# Patient Record
Sex: Male | Born: 1975 | Race: White | Hispanic: No | Marital: Married | Smoking: Never smoker
Health system: Southern US, Community
[De-identification: ages and names within clinical notes are randomized; demographics above are authoritative.]

## PROBLEM LIST (undated history)

## (undated) DIAGNOSIS — D649 Anemia, unspecified: Secondary | ICD-10-CM

## (undated) DIAGNOSIS — E785 Hyperlipidemia, unspecified: Secondary | ICD-10-CM

## (undated) DIAGNOSIS — K219 Gastro-esophageal reflux disease without esophagitis: Secondary | ICD-10-CM

## (undated) HISTORY — DX: Hyperlipidemia, unspecified: E78.5

## (undated) HISTORY — PX: HERNIA REPAIR: SHX51

---

## 2014-06-07 ENCOUNTER — Encounter: Payer: Self-pay | Admitting: Family Medicine

## 2014-06-07 ENCOUNTER — Ambulatory Visit (INDEPENDENT_AMBULATORY_CARE_PROVIDER_SITE_OTHER): Payer: PRIVATE HEALTH INSURANCE | Admitting: Family Medicine

## 2014-06-07 VITALS — BP 122/84 | Ht 75.0 in | Wt 355.0 lb

## 2014-06-07 DIAGNOSIS — E785 Hyperlipidemia, unspecified: Secondary | ICD-10-CM | POA: Insufficient documentation

## 2014-06-07 DIAGNOSIS — Z0000 Encounter for general adult medical examination without abnormal findings: Secondary | ICD-10-CM

## 2014-06-07 DIAGNOSIS — Z79899 Other long term (current) drug therapy: Secondary | ICD-10-CM

## 2014-06-07 MED ORDER — SIMVASTATIN 20 MG PO TABS: 20.0000 mg | ORAL_TABLET | Freq: Every day | ORAL | Status: DC

## 2014-06-07 NOTE — Progress Notes (Signed)
   Subjective:    Patient ID: John Terrell, male    DOB: 01/06/1976, 38 y.o.   MRN: [id number]  HPI The patient comes in today for a wellness visit.    A review of their health history was completed.  A review of medications was also completed.  Any needed refills; yes on simvastatin.   Eating habits: heatlh conscious  Falls/  MVA accidents in past few months: none  Regular exercise: walks and goes to gym as time permits.   Specialist pt sees on regular basis:none  Preventative health issues were discussed.   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 Negative for fever, activity change and appetite change.       Difficulty with substantial weight gain past 5 years. History of weight issues since college  HENT: Negative for congestion and rhinorrhea.   Eyes: Negative for discharge.  Respiratory: Negative for cough and wheezing.   Cardiovascular: Negative for chest pain.  Gastrointestinal: Negative for vomiting, abdominal pain and blood in stool.  Genitourinary: Negative for frequency and difficulty urinating.  Musculoskeletal: Negative for neck pain.  Skin: Negative for rash.  Allergic/Immunologic: Negative for environmental allergies and food allergies.  Neurological: Negative for weakness and headaches.  Psychiatric/Behavioral: Negative for agitation.  All other systems reviewed and are negative.      Objective:   Physical Exam  Constitutional: He appears well-developed and well-nourished.  Morbid obesity present BMI 44  HENT:  Head: Normocephalic and  atraumatic.  Right Ear: External ear normal.  Left Ear: External ear normal.  Nose: Nose normal.  Mouth/Throat: Oropharynx is clear and moist.  Eyes: EOM are normal. Pupils are equal, round, and reactive to light.  Neck: Normal range of motion. Neck supple. No thyromegaly present.  Cardiovascular: Normal rate, regular rhythm and normal heart sounds.   No murmur heard. Pulmonary/Chest: Effort normal and breath sounds normal. No respiratory distress. He has no wheezes.  Abdominal: Soft. Bowel sounds are normal. He exhibits no distension and no mass. There is no tenderness.  Genitourinary: Penis normal.  Musculoskeletal: Normal range of motion. He exhibits no edema.  Lymphadenopathy:    He has no cervical adenopathy.  Neurological: He is alert. He exhibits normal muscle tone.  Skin: Skin is warm and dry. No erythema.  Psychiatric: He has a normal mood and affect. His behavior is normal. Judgment normal.  Vitals reviewed.         Assessment & Plan:  Impression #1 wellness exam #2 morbid obesity discussed at length #3 hyperlipidemia status uncertain plan patient has artery had flu shot. Appropriate blood work. Old records. Exercise strongly encourage. Patient to start soon. Patient also to start Roby Loftseutra start has artery lost 5 pounds. Recheck every 6 months. WSL

## 2014-06-14 ENCOUNTER — Telehealth: Payer: Self-pay | Admitting: Family Medicine

## 2014-06-14 NOTE — Telephone Encounter (Signed)
Medical Records sent back in blue folder to Dr. Brett CanalesSteve.

## 2014-12-01 LAB — GLUCOSE, RANDOM: GLUCOSE: 102 mg/dL — AB (ref 70–99)

## 2014-12-01 LAB — LIPID PANEL
CHOLESTEROL: 160 mg/dL (ref 0–200)
HDL: 38 mg/dL — AB (ref >=40)
LDL CALC: 108 mg/dL — AB (ref 0–99)
Total CHOL/HDL Ratio: 4.2 Ratio
Triglycerides: 68 mg/dL (ref <150)
VLDL: 14 mg/dL (ref 0–40)

## 2014-12-01 LAB — HEPATIC FUNCTION PANEL
ALK PHOS: 66 U/L (ref 39–117)
ALT: 24 U/L (ref 0–53)
AST: 18 U/L (ref 0–37)
Albumin: 4.1 g/dL (ref 3.5–5.2)
Bilirubin, Direct: 0.1 mg/dL (ref 0.0–0.3)
Indirect Bilirubin: 0.5 mg/dL (ref 0.2–1.2)
TOTAL PROTEIN: 6.4 g/dL (ref 6.0–8.3)
Total Bilirubin: 0.6 mg/dL (ref 0.2–1.2)

## 2014-12-03 ENCOUNTER — Encounter: Payer: Self-pay | Admitting: Family Medicine

## 2014-12-03 ENCOUNTER — Ambulatory Visit (INDEPENDENT_AMBULATORY_CARE_PROVIDER_SITE_OTHER): Payer: PRIVATE HEALTH INSURANCE | Admitting: Family Medicine

## 2014-12-03 VITALS — BP 130/84 | Ht 75.0 in | Wt 376.0 lb

## 2014-12-03 DIAGNOSIS — E785 Hyperlipidemia, unspecified: Secondary | ICD-10-CM

## 2014-12-03 DIAGNOSIS — R7301 Impaired fasting glucose: Secondary | ICD-10-CM

## 2014-12-03 MED ORDER — CEFDINIR 300 MG PO CAPS: ORAL_CAPSULE | ORAL | Status: DC

## 2014-12-03 MED ORDER — FLUTICASONE PROPIONATE 50 MCG/ACT NA SUSP: 2.0000 | Freq: Every day | NASAL | Status: DC

## 2014-12-03 MED ORDER — SIMVASTATIN 20 MG PO TABS: 20.0000 mg | ORAL_TABLET | Freq: Every day | ORAL | Status: DC

## 2014-12-03 NOTE — Progress Notes (Signed)
   Subjective:    Patient ID: John Terrell, male    DOB: 07-09-1975, 39 y.o.   MRN: [id number]  Hyperlipidemia This is a chronic problem. The current episode started more than 1 year ago. Treatments tried: Simvastatin. There are no compliance problems.     Patient also has concerns of current congestion with an onset of 3 months. Facial pressure and congestion and clearing throat and with mucus and congesion  At sleep sometimes nose feels irritated, congestd in the nostrils  No hx of spring allergies, sinus round jan Patient has taken OTC for congestion with no relief. Patient states no other concerns at this time.   Patient done with ongoing challenges of morbid obesity. Some fatigue with this. Some chronic shortness of breath. Unfortunately not exercising. Next  Had blood work which showed mild elevated glucose. There is some family history of diabetes.  Results for orders placed or performed in visit on 06/07/14  Lipid panel  Result Value Ref Range   Cholesterol 160 0 - 200 mg/dL   Triglycerides 68 <597 mg/dL   HDL 38 (L) >=41 mg/dL   Total CHOL/HDL Ratio 4.2 Ratio   VLDL 14 0 - 40 mg/dL   LDL Cholesterol 638 (H) 0 - 99 mg/dL  Hepatic function panel  Result Value Ref Range   Total Bilirubin 0.6 0.2 - 1.2 mg/dL   Bilirubin, Direct 0.1 0.0 - 0.3 mg/dL   Indirect Bilirubin 0.5 0.2 - 1.2 mg/dL   Alkaline Phosphatase 66 39 - 117 U/L   AST 18 0 - 37 U/L   ALT 24 0 - 53 U/L   Total Protein 6.4 6.0 - 8.3 g/dL   Albumin 4.1 3.5 - 5.2 g/dL  Glucose, random  Result Value Ref Range   Glucose, Bld 102 (H) 70 - 99 mg/dL   Done well on exercising but really poor on eating  No close fam hx of diab, but some glu intol in the family  Drinks diet drinks or water, no reg sodas in twenty yrs    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   Review of Systems  no headache no chest pain some  chronic low back pain no change in bowel habits no blood in stool no rash    Objective:   Physical Exam  alert vitals stable blood pressure good on repeat. H&T normal. Lungs clear. Heart regular in rhythm. No CVA tenderness.  some persistent nasal congestion.      Assessment & Plan:   impression 1 hyperlipidemia controlled good #2 subacute sinusitis. With likely allergy element. #3 morbid obesity discussed length #4 impaired fasting glucose discussed at length land diet exercise discussed. Maintain same meds. Add antivirals. Add Flonase. Recheck in 6 months. WSL

## 2014-12-05 DIAGNOSIS — R7301 Impaired fasting glucose: Secondary | ICD-10-CM | POA: Insufficient documentation

## 2014-12-05 DIAGNOSIS — R7303 Prediabetes: Secondary | ICD-10-CM | POA: Insufficient documentation

## 2015-01-13 ENCOUNTER — Encounter: Payer: Self-pay | Admitting: Family Medicine

## 2015-01-13 ENCOUNTER — Ambulatory Visit (INDEPENDENT_AMBULATORY_CARE_PROVIDER_SITE_OTHER): Payer: PRIVATE HEALTH INSURANCE | Admitting: Family Medicine

## 2015-01-13 VITALS — BP 128/80 | Temp 99.9°F | Ht 74.0 in | Wt 370.0 lb

## 2015-01-13 DIAGNOSIS — B9689 Other specified bacterial agents as the cause of diseases classified elsewhere: Secondary | ICD-10-CM

## 2015-01-13 DIAGNOSIS — J019 Acute sinusitis, unspecified: Secondary | ICD-10-CM

## 2015-01-13 MED ORDER — LEVOFLOXACIN 500 MG PO TABS: 500.0000 mg | ORAL_TABLET | Freq: Every day | ORAL | Status: DC

## 2015-01-13 NOTE — Progress Notes (Signed)
   Subjective:    Patient ID: John Terrell, male    DOB: [birth year], 39 y.o.   MRN: [id number]  Cough This is a new problem. Episode onset: 3 days ago. Associated symptoms include a fever, headaches, nasal congestion, rhinorrhea and a sore throat. Pertinent negatives include no chest pain, ear pain or wheezing. Treatments tried: mucinex.   PMH intermittent sinus infections   Review of Systems  Constitutional: Positive for fever. Negative for activity change.  HENT: Positive for congestion, rhinorrhea and sore throat. Negative for ear pain.   Eyes: Negative for discharge.  Respiratory: Positive for cough. Negative for wheezing.   Cardiovascular: Negative for chest pain.  Neurological: Positive for headaches.       Objective:   Physical Exam  Constitutional: He appears well-developed.  HENT:  Head: Normocephalic.  Mouth/Throat: Oropharynx is clear and moist. No oropharyngeal exudate.  Neck: Normal range of motion.  Cardiovascular: Normal rate, regular rhythm and normal heart sounds.   No murmur heard. Pulmonary/Chest: Effort normal and breath sounds normal. He has no wheezes.  Lymphadenopathy:    He has no cervical adenopathy.  Neurological: He exhibits normal muscle tone.  Skin: Skin is warm and dry.  Nursing note and vitals reviewed.   Moderate sinus tenderness no sign of lung involvement      Assessment & Plan:  Viral syndrome Low-grade fever Secondary sinusitis Antibiotics prescribed warning signs discussed follow-up if problems

## 2015-03-16 ENCOUNTER — Ambulatory Visit (INDEPENDENT_AMBULATORY_CARE_PROVIDER_SITE_OTHER): Payer: PRIVATE HEALTH INSURANCE | Admitting: Family Medicine

## 2015-03-16 ENCOUNTER — Encounter: Payer: Self-pay | Admitting: Family Medicine

## 2015-03-16 VITALS — BP 110/72 | Ht 75.0 in | Wt 375.6 lb

## 2015-03-16 DIAGNOSIS — J019 Acute sinusitis, unspecified: Secondary | ICD-10-CM | POA: Diagnosis not present

## 2015-03-16 DIAGNOSIS — B9689 Other specified bacterial agents as the cause of diseases classified elsewhere: Secondary | ICD-10-CM

## 2015-03-16 MED ORDER — AZITHROMYCIN 250 MG PO TABS: ORAL_TABLET | ORAL | Status: DC

## 2015-03-16 MED ORDER — HYDROCODONE-HOMATROPINE 5-1.5 MG/5ML PO SYRP: ORAL_SOLUTION | ORAL | Status: DC

## 2015-03-16 NOTE — Progress Notes (Signed)
   Subjective:    Patient ID: John Terrell, male    DOB: [birth year], 39 y.o.   MRN: [id number]  Sinus Problem This is a new problem. The current episode started in the past 7 days. Associated symptoms include congestion, coughing, headaches and a sore throat. Treatments tried: mucinex.   Sat started with cold, course of this wk sinus  Low gr temp felt sweating and achey  With drainage, gunky mucus     Review of Systems  HENT: Positive for congestion and sore throat.   Respiratory: Positive for cough.   Neurological: Positive for headaches.       Objective:   Physical Exam   Alert moderate malaise vitals stable HET moderate nasal congestion frontal tenderness pharynx erythematous. Neck supple lungs clear. Heart regular in rhythm.     Assessment & Plan:  Impression acute rhinosinusitis plan antibiotics prescribed. Symptom care discussed. Warning signs discussed WSL

## 2015-06-08 ENCOUNTER — Other Ambulatory Visit: Payer: Self-pay | Admitting: Family Medicine

## 2015-06-10 ENCOUNTER — Encounter: Payer: PRIVATE HEALTH INSURANCE | Admitting: Family Medicine

## 2015-07-05 ENCOUNTER — Encounter: Payer: PRIVATE HEALTH INSURANCE | Admitting: Family Medicine

## 2015-07-16 ENCOUNTER — Other Ambulatory Visit: Payer: Self-pay | Admitting: Family Medicine

## 2015-07-18 ENCOUNTER — Telehealth: Payer: Self-pay | Admitting: Family Medicine

## 2015-07-18 NOTE — Telephone Encounter (Signed)
Pt is needing a refill on his simvastatin (ZOCOR) 20 MG tablet.     [facility]

## 2015-07-18 NOTE — Telephone Encounter (Signed)
[facility] 07/18/15 ( medication refilled for 30 days needs office visit.)

## 2015-08-04 ENCOUNTER — Encounter: Payer: Self-pay | Admitting: Family Medicine

## 2015-08-04 ENCOUNTER — Ambulatory Visit (INDEPENDENT_AMBULATORY_CARE_PROVIDER_SITE_OTHER): Payer: PRIVATE HEALTH INSURANCE | Admitting: Family Medicine

## 2015-08-04 VITALS — BP 128/84 | Ht 75.0 in | Wt 382.0 lb

## 2015-08-04 DIAGNOSIS — E785 Hyperlipidemia, unspecified: Secondary | ICD-10-CM

## 2015-08-04 DIAGNOSIS — Z0000 Encounter for general adult medical examination without abnormal findings: Secondary | ICD-10-CM

## 2015-08-04 DIAGNOSIS — Z131 Encounter for screening for diabetes mellitus: Secondary | ICD-10-CM | POA: Diagnosis not present

## 2015-08-04 DIAGNOSIS — Z79899 Other long term (current) drug therapy: Secondary | ICD-10-CM | POA: Diagnosis not present

## 2015-08-04 DIAGNOSIS — R5383 Other fatigue: Secondary | ICD-10-CM

## 2015-08-04 DIAGNOSIS — Z13220 Encounter for screening for lipoid disorders: Secondary | ICD-10-CM

## 2015-08-04 MED ORDER — FLUTICASONE PROPIONATE 50 MCG/ACT NA SUSP: 2.0000 | Freq: Every day | NASAL | Status: DC

## 2015-08-04 MED ORDER — SIMVASTATIN 20 MG PO TABS: ORAL_TABLET | ORAL | Status: DC

## 2015-08-04 NOTE — Progress Notes (Signed)
   Subjective:    Patient ID: John Terrell, male    DOB: [birth year], 40 y.o.   MRN: [id number]  HPI Compliant with lipid medications. No obvious side effects. Meds reviewed today. Recent blood work reviewed once again. Claims compliance with the medicine. Mostly watching fat diet exercise often been lately   Also The patient comes in today for a wellness visit.  Both parents still alive, mo is having some cholesterol issues  No colonc ca    A review of their health history was completed.  A review of medications was also completed.  Any needed refills; yes  Eating habits: trying to eat healthy  Falls/  MVA accidents in past few months: none  Regular exercise: some at gym and walking, ove4all not so good with exrcise, notrs substantial fatigue and inability to carve out time for exercise  Generally does 45 min of cario plus weights  Some sports in [employer]   Specialist pt sees on regular basis: no  Preventative health issues were discussed.   Additional concerns: none   Review of Systems  Constitutional: Negative for fever, activity change, appetite change and fatigue.  HENT: Negative for congestion and rhinorrhea.   Eyes: Negative for discharge.  Respiratory: Negative for cough and wheezing.   Cardiovascular: Negative for chest pain.  Gastrointestinal: Negative for vomiting, abdominal pain and blood in stool.  Genitourinary: Negative for frequency and difficulty urinating.  Musculoskeletal: Negative for neck pain.  Skin: Negative for rash.  Allergic/Immunologic: Negative for environmental allergies and food allergies.  Neurological: Negative for weakness and headaches.  Psychiatric/Behavioral: Negative for behavioral problems and agitation.       Objective:   Physical Exam  Constitutional: He appears well-developed and well-nourished.  Substantial morbid obesity present  HENT:  Head: Normocephalic and atraumatic.  Right Ear: External ear normal.  Left  Ear: External ear normal.  Nose: Nose normal.  Mouth/Throat: Oropharynx is clear and moist.  Eyes: EOM are normal. Pupils are equal, round, and reactive to light.  Neck: Normal range of motion. Neck supple. No thyromegaly present.  Cardiovascular: Normal rate, regular rhythm and normal heart sounds.   No murmur heard. Pulmonary/Chest: Effort normal and breath sounds normal. No respiratory distress. He has no wheezes.  Abdominal: Soft. Bowel sounds are normal. He exhibits no distension and no mass. There is no tenderness.  Genitourinary: Penis normal.  Musculoskeletal: Normal range of motion. He exhibits no edema.  Lymphadenopathy:    He has no cervical adenopathy.  Neurological: He is alert. He exhibits normal muscle tone.  Skin: Skin is warm and dry. No erythema.  Psychiatric: He has a normal mood and affect. His behavior is normal. Judgment normal.  Vitals reviewed.         Assessment & Plan:  Impression 1 wellness exam #2 morbid obesesity discussed at great length #3 hyperlipidemia control uncertain discuss time for blood work meds reviewed #4 impaired fasting glucose status uncertain plan appropriate blood work. Patient to get back into diet and exercise more. I've advised if he doesn't start turning the corner with his BMI we will need to consider bariatric intervention long discussion held in this regard

## 2015-08-16 ENCOUNTER — Other Ambulatory Visit: Payer: Self-pay | Admitting: Family Medicine

## 2015-08-17 ENCOUNTER — Other Ambulatory Visit: Payer: Self-pay | Admitting: Family Medicine

## 2015-08-30 LAB — LIPID PANEL
Chol/HDL Ratio: 4.3 ratio units (ref 0.0–5.0)
Cholesterol, Total: 189 mg/dL (ref 100–199)
HDL: 44 mg/dL (ref >39)
LDL Calculated: 129 mg/dL — ABNORMAL HIGH (ref 0–99)
TRIGLYCERIDES: 81 mg/dL (ref 0–149)
VLDL Cholesterol Cal: 16 mg/dL (ref 5–40)

## 2015-08-30 LAB — HEPATIC FUNCTION PANEL
ALT: 49 IU/L — AB (ref 0–44)
AST: 54 IU/L — ABNORMAL HIGH (ref 0–40)
Albumin: 4.3 g/dL (ref 3.5–5.5)
Alkaline Phosphatase: 93 IU/L (ref 39–117)
BILIRUBIN TOTAL: 0.6 mg/dL (ref 0.0–1.2)
BILIRUBIN, DIRECT: 0.12 mg/dL (ref 0.00–0.40)
TOTAL PROTEIN: 7 g/dL (ref 6.0–8.5)

## 2015-08-30 LAB — GLUCOSE, RANDOM: Glucose: 102 mg/dL — ABNORMAL HIGH (ref 65–99)

## 2015-08-30 LAB — TSH: TSH: 5.79 u[IU]/mL — ABNORMAL HIGH (ref 0.450–4.500)

## 2015-09-08 ENCOUNTER — Encounter: Payer: Self-pay | Admitting: Family Medicine

## 2015-09-08 ENCOUNTER — Ambulatory Visit (INDEPENDENT_AMBULATORY_CARE_PROVIDER_SITE_OTHER): Payer: PRIVATE HEALTH INSURANCE | Admitting: Family Medicine

## 2015-09-08 VITALS — BP 134/90 | Ht 75.0 in | Wt 376.0 lb

## 2015-09-08 DIAGNOSIS — E785 Hyperlipidemia, unspecified: Secondary | ICD-10-CM | POA: Diagnosis not present

## 2015-09-08 DIAGNOSIS — R946 Abnormal results of thyroid function studies: Secondary | ICD-10-CM

## 2015-09-08 DIAGNOSIS — R7301 Impaired fasting glucose: Secondary | ICD-10-CM

## 2015-09-08 DIAGNOSIS — R7989 Other specified abnormal findings of blood chemistry: Secondary | ICD-10-CM

## 2015-09-08 DIAGNOSIS — R5383 Other fatigue: Secondary | ICD-10-CM

## 2015-09-08 DIAGNOSIS — Z79899 Other long term (current) drug therapy: Secondary | ICD-10-CM | POA: Diagnosis not present

## 2015-09-08 DIAGNOSIS — R748 Abnormal levels of other serum enzymes: Secondary | ICD-10-CM

## 2015-09-08 NOTE — Progress Notes (Signed)
   Subjective:    Patient ID: John Terrell, male    DOB: 08/31/1975, 40 y.o.   MRN: [id number] Patient arrives office for protracted discussion HPI No eating that mornign the day he took blood work   Results for orders placed or performed in visit on 08/04/15  Lipid panel  Result Value Ref Range   Cholesterol, Total 189 100 - 199 mg/dL   Triglycerides 81 0 - 149 mg/dL   HDL 44 >84>39 mg/dL   VLDL Cholesterol Cal 16 5 - 40 mg/dL   LDL Calculated 696129 (H) 0 - 99 mg/dL   Chol/HDL Ratio 4.3 0.0 - 5.0 ratio units  Hepatic function panel  Result Value Ref Range   Total Protein 7.0 6.0 - 8.5 g/dL   Albumin 4.3 3.5 - 5.5 g/dL   Bilirubin Total 0.6 0.0 - 1.2 mg/dL   Bilirubin, Direct 2.950.12 0.00 - 0.40 mg/dL   Alkaline Phosphatase 93 39 - 117 IU/L   AST 54 (H) 0 - 40 IU/L   ALT 49 (H) 0 - 44 IU/L  Glucose, random  Result Value Ref Range   Glucose 102 (H) 65 - 99 mg/dL  TSH  Result Value Ref Range   TSH 5.790 (H) 0.450 - 4.500 uIU/mL   Elevated Liver enzymes no hx of this . No substantial alcohol use. No history of hepatitis  Patient struggles with morbid obesity. Currently working on a new regimen of diet and exercise. This adds to increased fatigue and dyspnea. Next  Patient claims compliance with his lipid medication. Watching fat in his diet better job  No major family history diabetes or thyroid disease  Patient has never had abnormalities on thyroid blood work.  Patient is just started exercising regularly   Review of Systems No headache, no major weight loss or weight gain, no chest pain no back pain abdominal pain no change in bowel habits complete ROS otherwise negative     Objective:   Physical Exam  Alert no acute distress morbid obesity present blood pressure stable lungs clear heart rare rhythm ankles without edema    Assessment & Plan:  Impression 1 hyperlipidemia discussed at length slightly suboptimum but do not want to increase Zocor because of #2 #2 elevated  liver enzymes mild in nature may be related to Zocor but patient's Ronis a long time. Likely more related to obesity discussed #3 morbid obesity discussed #4 elevated TSH. No excess fatigue at this time. #5 impaired fasting glucose also discussed at length plan 25 minutes spent most in discussion metabolically patient is going wrong direction repeat blood work in several months further intervention at that time with all of these aspects discussed i.e. will need further liver assessment if persists WSL

## 2015-09-11 DIAGNOSIS — R748 Abnormal levels of other serum enzymes: Secondary | ICD-10-CM | POA: Insufficient documentation

## 2015-09-11 DIAGNOSIS — R7989 Other specified abnormal findings of blood chemistry: Secondary | ICD-10-CM | POA: Insufficient documentation

## 2015-12-07 ENCOUNTER — Telehealth: Payer: Self-pay | Admitting: Family Medicine

## 2015-12-07 NOTE — Telephone Encounter (Signed)
Med called into pharm. Pt notified on voicemail 

## 2015-12-07 NOTE — Telephone Encounter (Signed)
simvastatin (ZOCOR) 20 MG tablet   Pt is out of town an needs a one time refill  On this med. Can [facility]    [facility] [place] [phone] [address] on [address]

## 2016-03-10 LAB — HEPATIC FUNCTION PANEL
ALT: 22 IU/L (ref 0–44)
AST: 17 IU/L (ref 0–40)
Albumin: 4.3 g/dL (ref 3.5–5.5)
Alkaline Phosphatase: 86 IU/L (ref 39–117)
BILIRUBIN TOTAL: 0.6 mg/dL (ref 0.0–1.2)
BILIRUBIN, DIRECT: 0.14 mg/dL (ref 0.00–0.40)
TOTAL PROTEIN: 6.6 g/dL (ref 6.0–8.5)

## 2016-03-10 LAB — LIPID PANEL
CHOL/HDL RATIO: 4.7 ratio (ref 0.0–5.0)
Cholesterol, Total: 170 mg/dL (ref 100–199)
HDL: 36 mg/dL — ABNORMAL LOW (ref >39)
LDL CALC: 122 mg/dL — AB (ref 0–99)
Triglycerides: 61 mg/dL (ref 0–149)
VLDL Cholesterol Cal: 12 mg/dL (ref 5–40)

## 2016-03-10 LAB — TSH: TSH: 4.55 u[IU]/mL — ABNORMAL HIGH (ref 0.450–4.500)

## 2016-03-10 LAB — GLUCOSE, RANDOM: Glucose: 104 mg/dL — ABNORMAL HIGH (ref 65–99)

## 2016-03-19 ENCOUNTER — Other Ambulatory Visit: Payer: Self-pay | Admitting: Family Medicine

## 2016-03-20 ENCOUNTER — Ambulatory Visit: Payer: PRIVATE HEALTH INSURANCE | Admitting: Family Medicine

## 2016-03-23 ENCOUNTER — Ambulatory Visit (INDEPENDENT_AMBULATORY_CARE_PROVIDER_SITE_OTHER): Payer: PRIVATE HEALTH INSURANCE | Admitting: Family Medicine

## 2016-03-23 ENCOUNTER — Encounter: Payer: Self-pay | Admitting: Family Medicine

## 2016-03-23 VITALS — BP 128/74 | Ht 75.0 in | Wt 376.8 lb

## 2016-03-23 DIAGNOSIS — E785 Hyperlipidemia, unspecified: Secondary | ICD-10-CM | POA: Diagnosis not present

## 2016-03-23 DIAGNOSIS — R946 Abnormal results of thyroid function studies: Secondary | ICD-10-CM

## 2016-03-23 DIAGNOSIS — R748 Abnormal levels of other serum enzymes: Secondary | ICD-10-CM | POA: Diagnosis not present

## 2016-03-23 DIAGNOSIS — Z79899 Other long term (current) drug therapy: Secondary | ICD-10-CM

## 2016-03-23 DIAGNOSIS — R7989 Other specified abnormal findings of blood chemistry: Secondary | ICD-10-CM

## 2016-03-23 MED ORDER — SIMVASTATIN 20 MG PO TABS: 20.0000 mg | ORAL_TABLET | Freq: Every day | ORAL | 5 refills | Status: DC

## 2016-03-23 NOTE — Progress Notes (Signed)
   Subjective:    Patient ID: John Terrell, male    DOB: 12/18/1975, 40 y.o.   MRN: [id number] Patient arrives office for follow-up of numerous concerns Hyperlipidemia  This is a chronic problem. Treatments tried: simvastatin. There are no compliance problems (exericses at the gym regularly, health conscious with diet., takes meds every day. ).   Patient continues to take lipid medication regularly. No obvious side effects from it. Generally does not miss a dose. Prior blood work results are reviewed with patient. Patient continues to work on fat intake in diet  Patient had elevated liver enzymes on last blood work. Working diagnosis was likely secondary to fatty liver. Patient is work hard on diet. Cutting back fat intake. Lost 10 pounds.  Patient also has helped with elevation of sugars. Mild in nature. Working on this. No close family history of diabetes. Next  Patient realizes he is morbidly obese. Working hard on diet. Has started exercising very regularly Gets flu vaccine at work.   No concerns today.   Results for orders placed or performed in visit on 09/08/15  Lipid panel  Result Value Ref Range   Cholesterol, Total 170 100 - 199 mg/dL   Triglycerides 61 0 - 149 mg/dL   HDL 36 (L) >40>39 mg/dL   VLDL Cholesterol Cal 12 5 - 40 mg/dL   LDL Calculated 981122 (H) 0 - 99 mg/dL   Chol/HDL Ratio 4.7 0.0 - 5.0 ratio units  Hepatic function panel  Result Value Ref Range   Total Protein 6.6 6.0 - 8.5 g/dL   Albumin 4.3 3.5 - 5.5 g/dL   Bilirubin Total 0.6 0.0 - 1.2 mg/dL   Bilirubin, Direct 1.910.14 0.00 - 0.40 mg/dL   Alkaline Phosphatase 86 39 - 117 IU/L   AST 17 0 - 40 IU/L   ALT 22 0 - 44 IU/L  Glucose, random  Result Value Ref Range   Glucose 104 (H) 65 - 99 mg/dL  TSH  Result Value Ref Range   TSH 4.550 (H) 0.450 - 4.500 uIU/mL   Has worked harder on [employer] gym, doing cardio multi times per wk  Juicer bfast and lunch, juice only   trying to stay active and going to the parks  [place] for improving sleep       As lost less than ten pounds  mon has hi cholesterol, handled with diet   Impression and plan dictated here because computer not allowing me to place this under impression and plan  Impression #1 morbid obesity patient realizes this is serious. Has lost 10 pounds long way to go  #2 elevated liver enzymes currently resolved likely due to fatty liver #3 hyperlipidemia improved maintain same #4 prediabetes ongoing discussed encouraged to cut down #5 elevated TSH improved. Likely represents euthyroid slight elevation of TSH rationale discussed no treatment at this time diet exercise discussed. Maintain same dose medications. Recheck in 6 months. Blood work  Review of Systems No headache, no major weight loss or weight gain, no chest pain no  Weight down 10 pounds. Vitals stable blood pressure excellent on repeat morbid obesity persists. Lungs clear heart regular rate rhythm. Ankles without edema see above impression pain  no change in bowel habits complete ROS otherwise negative     Objective:   Physical Exam        Assessment & Plan:

## 2016-10-22 ENCOUNTER — Other Ambulatory Visit: Payer: Self-pay | Admitting: Family Medicine

## 2016-11-30 ENCOUNTER — Other Ambulatory Visit: Payer: Self-pay | Admitting: Family Medicine

## 2016-12-12 LAB — BASIC METABOLIC PANEL
BUN / CREAT RATIO: 15 (ref 9–20)
BUN: 12 mg/dL (ref 6–20)
CO2: 22 mmol/L (ref 20–29)
CREATININE: 0.79 mg/dL (ref 0.76–1.27)
Calcium: 8.8 mg/dL (ref 8.7–10.2)
Chloride: 103 mmol/L (ref 96–106)
GFR calc Af Amer: 132 mL/min/{1.73_m2} (ref >59)
GFR calc non Af Amer: 114 mL/min/{1.73_m2} (ref >59)
GLUCOSE: 106 mg/dL — AB (ref 65–99)
Potassium: 4.6 mmol/L (ref 3.5–5.2)
SODIUM: 143 mmol/L (ref 134–144)

## 2016-12-12 LAB — LIPID PANEL
CHOL/HDL RATIO: 5 ratio (ref 0.0–5.0)
CHOLESTEROL TOTAL: 205 mg/dL — AB (ref 100–199)
HDL: 41 mg/dL (ref >39)
LDL CALC: 147 mg/dL — AB (ref 0–99)
TRIGLYCERIDES: 87 mg/dL (ref 0–149)
VLDL Cholesterol Cal: 17 mg/dL (ref 5–40)

## 2016-12-12 LAB — HEPATIC FUNCTION PANEL
ALBUMIN: 3.9 g/dL (ref 3.5–5.5)
ALK PHOS: 79 IU/L (ref 39–117)
ALT: 23 IU/L (ref 0–44)
AST: 21 IU/L (ref 0–40)
BILIRUBIN, DIRECT: 0.12 mg/dL (ref 0.00–0.40)
Bilirubin Total: 0.4 mg/dL (ref 0.0–1.2)
TOTAL PROTEIN: 6 g/dL (ref 6.0–8.5)

## 2016-12-12 LAB — TSH: TSH: 7.35 u[IU]/mL — ABNORMAL HIGH (ref 0.450–4.500)

## 2016-12-17 ENCOUNTER — Telehealth: Payer: Self-pay | Admitting: Family Medicine

## 2016-12-17 NOTE — Telephone Encounter (Signed)
error 

## 2016-12-18 ENCOUNTER — Telehealth: Payer: Self-pay | Admitting: Family Medicine

## 2016-12-18 MED ORDER — SIMVASTATIN 20 MG PO TABS: 20.0000 mg | ORAL_TABLET | Freq: Every day | ORAL | 0 refills | Status: DC

## 2016-12-18 NOTE — Telephone Encounter (Signed)
Spoke with patient and informed him that refills were sent into pharmacy. Please keep office visit on 07/11. Patient verbalized understanding.

## 2016-12-18 NOTE — Telephone Encounter (Signed)
[facility] [place]

## 2017-01-02 ENCOUNTER — Encounter: Payer: Self-pay | Admitting: Family Medicine

## 2017-01-02 ENCOUNTER — Ambulatory Visit (INDEPENDENT_AMBULATORY_CARE_PROVIDER_SITE_OTHER): Payer: PRIVATE HEALTH INSURANCE | Admitting: Family Medicine

## 2017-01-02 VITALS — BP 126/78 | Ht 73.0 in | Wt 388.1 lb

## 2017-01-02 DIAGNOSIS — E7800 Pure hypercholesterolemia, unspecified: Secondary | ICD-10-CM | POA: Diagnosis not present

## 2017-01-02 DIAGNOSIS — R946 Abnormal results of thyroid function studies: Secondary | ICD-10-CM

## 2017-01-02 DIAGNOSIS — E785 Hyperlipidemia, unspecified: Secondary | ICD-10-CM | POA: Diagnosis not present

## 2017-01-02 DIAGNOSIS — Z0000 Encounter for general adult medical examination without abnormal findings: Secondary | ICD-10-CM | POA: Diagnosis not present

## 2017-01-02 DIAGNOSIS — R7989 Other specified abnormal findings of blood chemistry: Secondary | ICD-10-CM

## 2017-01-02 MED ORDER — SIMVASTATIN 20 MG PO TABS: 20.0000 mg | ORAL_TABLET | Freq: Every day | ORAL | 1 refills | Status: DC

## 2017-01-02 MED ORDER — LEVOTHYROXINE SODIUM 50 MCG PO TABS: 50.0000 ug | ORAL_TABLET | Freq: Every day | ORAL | 1 refills | Status: DC

## 2017-01-02 NOTE — Progress Notes (Signed)
 Subjective:    Patient ID: John Terrell, male    DOB: 09/06/1975, 40 y.o.   MRN: [id number]  HPI  The patient comes in today for a wellness visit.    A review of their health history was completed.  A review of medications was also completed.  Any needed refills; Yes   Eating habits: Patient states eating habits are good. Tries to watch what he eats.  Falls/  MVA accidents in past few months: None  Regular exercise:  Patient states exercises 3-4 times per week at the gym for an hour.   Specialist pt sees on regular basis: None   Preventative health issues were discussed.   Additional concerns:Patient has concerns of congestion to right ear. Has noticed diminished hearing in the year.  Patient continues to take lipid medication regularly. No obvious side effects from it. Generally does not miss a dose. Prior blood work results are reviewed with patient. Patient continues to work on fat intake in diet. LDL is suboptimal in. Patient would like to work harder on his own diet before going to higher dose of medication.  Hypothyroidism has now been confirmed on this set of blood work. TSH significantly elevated  Reports exercise has not been ideal due to both family and work stress worse. This has improved somewhat in hopes to start exercising more soon.    Review of Systems  Constitutional: Negative for activity change, appetite change and fever.  HENT: Negative for congestion and rhinorrhea.   Eyes: Negative for discharge.  Respiratory: Negative for cough and wheezing.   Cardiovascular: Negative for chest pain.  Gastrointestinal: Negative for abdominal pain, blood in stool and vomiting.  Genitourinary: Negative for difficulty urinating and frequency.  Musculoskeletal: Negative for neck pain.  Skin: Negative for rash.  Allergic/Immunologic: Negative for environmental allergies and food allergies.  Neurological: Negative for weakness and headaches.  Psychiatric/Behavioral:  Negative for agitation.  All other systems reviewed and are negative.      Objective:   Physical Exam  Constitutional: He appears well-developed and well-nourished.  Substantial obesity identified with BMI of 51  HENT:  Head: Normocephalic and atraumatic.  Right Ear: External ear normal.  Left Ear: External ear normal.  Nose: Nose normal.  Mouth/Throat: Oropharynx is clear and moist.  Bilateral cerumen impaction  Eyes: Pupils are equal, round, and reactive to light. EOM are normal.  Neck: Normal range of motion. Neck supple. No thyromegaly present.  Cardiovascular: Normal rate, regular rhythm and normal heart sounds.   No murmur heard. Pulmonary/Chest: Effort normal and breath sounds normal. No respiratory distress. He has no wheezes.  Abdominal: Soft. Bowel sounds are normal. He exhibits no distension and no mass. There is no tenderness.  Genitourinary: Penis normal.  Musculoskeletal: Normal range of motion. He exhibits no edema.  Lymphadenopathy:    He has no cervical adenopathy.  Neurological: He is alert. He exhibits normal muscle tone.  Skin: Skin is warm and dry. No erythema.  Psychiatric: He has a normal mood and affect. His behavior is normal. Judgment normal.  Vitals reviewed.         Assessment & Plan:  Impression wellness exam plus chronic health issue management visit.  Wellness. Diet discussed at length. Exercise discussed at length. Of concern is morbid obesity. Ongoing challenge for the patient. Continues to gain weight. Have advised patient if this persists will encourage more consideration towards bariatric intervention  Problem/chronic health management  #1 hyperlipidemia. Numbers suboptimum. Patient wishes to maintain same dose   for now will maintain next  #2 hypothyroidism we been watching this now I feel confirmed. Supplementation warranted discussed. No need for major workup at this time but simply supplementation discussed next  #3 cerumen  impaction given number for Dr. TL to consider visit for cerumen removal next  #4 morbid obesity is noted above  Follow-up in 6 months. Prescriptions refilled. Diet exercise discussed. Blood work reviewed. 

## 2017-01-03 NOTE — Progress Notes (Signed)
Subjective:    Patient ID: John Terrell, male    DOB: Dec 30, 1975, 41 y.o.   MRN: [id number]  HPI  The patient comes in today for a wellness visit.    A review of their health history was completed.  A review of medications was also completed.  Any needed refills; Yes   Eating habits: Patient states eating habits are good. Tries to watch what he eats.  Falls/  MVA accidents in past few months: None  Regular exercise:  Patient states exercises 3-4 times per week at the gym for an hour.   Specialist pt sees on regular basis: None   Preventative health issues were discussed.   Additional concerns:Patient has concerns of congestion to right ear. Has noticed diminished hearing in the year.  Patient continues to take lipid medication regularly. No obvious side effects from it. Generally does not miss a dose. Prior blood work results are reviewed with patient. Patient continues to work on fat intake in diet. LDL is suboptimal in. Patient would like to work harder on his own diet before going to higher dose of medication.  Hypothyroidism has now been confirmed on this set of blood work. TSH significantly elevated  Reports exercise has not been ideal due to both family and work stress worse. This has improved somewhat in hopes to start exercising more soon.    Review of Systems  Constitutional: Negative for activity change, appetite change and fever.  HENT: Negative for congestion and rhinorrhea.   Eyes: Negative for discharge.  Respiratory: Negative for cough and wheezing.   Cardiovascular: Negative for chest pain.  Gastrointestinal: Negative for abdominal pain, blood in stool and vomiting.  Genitourinary: Negative for difficulty urinating and frequency.  Musculoskeletal: Negative for neck pain.  Skin: Negative for rash.  Allergic/Immunologic: Negative for environmental allergies and food allergies.  Neurological: Negative for weakness and headaches.  Psychiatric/Behavioral:  Negative for agitation.  All other systems reviewed and are negative.      Objective:   Physical Exam  Constitutional: He appears well-developed and well-nourished.  Substantial obesity identified with BMI of 51  HENT:  Head: Normocephalic and atraumatic.  Right Ear: External ear normal.  Left Ear: External ear normal.  Nose: Nose normal.  Mouth/Throat: Oropharynx is clear and moist.  Bilateral cerumen impaction  Eyes: Pupils are equal, round, and reactive to light. EOM are normal.  Neck: Normal range of motion. Neck supple. No thyromegaly present.  Cardiovascular: Normal rate, regular rhythm and normal heart sounds.   No murmur heard. Pulmonary/Chest: Effort normal and breath sounds normal. No respiratory distress. He has no wheezes.  Abdominal: Soft. Bowel sounds are normal. He exhibits no distension and no mass. There is no tenderness.  Genitourinary: Penis normal.  Musculoskeletal: Normal range of motion. He exhibits no edema.  Lymphadenopathy:    He has no cervical adenopathy.  Neurological: He is alert. He exhibits normal muscle tone.  Skin: Skin is warm and dry. No erythema.  Psychiatric: He has a normal mood and affect. His behavior is normal. Judgment normal.  Vitals reviewed.         Assessment & Plan:  Impression wellness exam plus chronic health issue management visit.  Wellness. Diet discussed at length. Exercise discussed at length. Of concern is morbid obesity. Ongoing challenge for the patient. Continues to gain weight. Have advised patient if this persists will encourage more consideration towards bariatric intervention  Problem/chronic health management  #1 hyperlipidemia. Numbers suboptimum. Patient wishes to maintain same dose  for now will maintain next  #2 hypothyroidism we been watching this now I feel confirmed. Supplementation warranted discussed. No need for major workup at this time but simply supplementation discussed next  #3 cerumen  impaction given number for Dr. Seth BakeL to consider visit for cerumen removal next  #4 morbid obesity is noted above  Follow-up in 6 months. Prescriptions refilled. Diet exercise discussed. Blood work reviewed.

## 2017-02-07 ENCOUNTER — Ambulatory Visit (INDEPENDENT_AMBULATORY_CARE_PROVIDER_SITE_OTHER): Payer: PRIVATE HEALTH INSURANCE | Admitting: Otolaryngology

## 2017-02-07 DIAGNOSIS — H900 Conductive hearing loss, bilateral: Secondary | ICD-10-CM

## 2017-02-07 DIAGNOSIS — H6123 Impacted cerumen, bilateral: Secondary | ICD-10-CM

## 2017-03-01 ENCOUNTER — Encounter (HOSPITAL_COMMUNITY): Payer: Self-pay | Admitting: Emergency Medicine

## 2017-03-01 ENCOUNTER — Emergency Department (HOSPITAL_COMMUNITY): Payer: PRIVATE HEALTH INSURANCE

## 2017-03-01 ENCOUNTER — Telehealth: Payer: Self-pay | Admitting: Family Medicine

## 2017-03-01 ENCOUNTER — Emergency Department (HOSPITAL_COMMUNITY)
Admission: EM | Admit: 2017-03-01 | Discharge: 2017-03-01 | Disposition: A | Discharge status: Home / Self Care | Payer: PRIVATE HEALTH INSURANCE | Attending: Emergency Medicine | Admitting: Emergency Medicine

## 2017-03-01 DIAGNOSIS — Z79899 Other long term (current) drug therapy: Secondary | ICD-10-CM | POA: Insufficient documentation

## 2017-03-01 DIAGNOSIS — R079 Chest pain, unspecified: Secondary | ICD-10-CM | POA: Diagnosis present

## 2017-03-01 DIAGNOSIS — K30 Functional dyspepsia: Secondary | ICD-10-CM | POA: Diagnosis not present

## 2017-03-01 DIAGNOSIS — R112 Nausea with vomiting, unspecified: Secondary | ICD-10-CM | POA: Diagnosis not present

## 2017-03-01 LAB — LIPASE, BLOOD: Lipase: 27 U/L (ref 11–51)

## 2017-03-01 LAB — HEPATIC FUNCTION PANEL
ALT: 20 U/L (ref 17–63)
AST: 24 U/L (ref 15–41)
Albumin: 3.2 g/dL — ABNORMAL LOW (ref 3.5–5.0)
Alkaline Phosphatase: 69 U/L (ref 38–126)
BILIRUBIN TOTAL: 0.6 mg/dL (ref 0.3–1.2)
Bilirubin, Direct: 0.1 mg/dL (ref 0.1–0.5)
Indirect Bilirubin: 0.5 mg/dL (ref 0.3–0.9)
Total Protein: 6.4 g/dL — ABNORMAL LOW (ref 6.5–8.1)

## 2017-03-01 LAB — BASIC METABOLIC PANEL
ANION GAP: 7 (ref 5–15)
BUN: 12 mg/dL (ref 6–20)
CALCIUM: 8.6 mg/dL — AB (ref 8.9–10.3)
CHLORIDE: 105 mmol/L (ref 101–111)
CO2: 29 mmol/L (ref 22–32)
Creatinine, Ser: 0.93 mg/dL (ref 0.61–1.24)
GFR calc non Af Amer: >60 mL/min (ref >60)
Glucose, Bld: 101 mg/dL — ABNORMAL HIGH (ref 65–99)
POTASSIUM: 4.1 mmol/L (ref 3.5–5.1)
Sodium: 141 mmol/L (ref 135–145)

## 2017-03-01 LAB — CBC
HEMATOCRIT: 38.8 % — AB (ref 39.0–52.0)
HEMOGLOBIN: 12.5 g/dL — AB (ref 13.0–17.0)
MCH: 27.1 pg (ref 26.0–34.0)
MCHC: 32.2 g/dL (ref 30.0–36.0)
MCV: 84.2 fL (ref 78.0–100.0)
Platelets: 202 10*3/uL (ref 150–400)
RBC: 4.61 MIL/uL (ref 4.22–5.81)
RDW: 13.4 % (ref 11.5–15.5)
WBC: 9.8 10*3/uL (ref 4.0–10.5)

## 2017-03-01 LAB — TROPONIN I: Troponin I: <0.03 ng/mL (ref <0.03)

## 2017-03-01 MED ORDER — GI COCKTAIL ~~LOC~~
30.0000 mL | Freq: Once | ORAL | Status: AC
  Administered 2017-03-01: 30 mL via ORAL
  Filled 2017-03-01: qty 30

## 2017-03-01 MED ORDER — FAMOTIDINE 20 MG PO TABS: 20.0000 mg | ORAL_TABLET | Freq: Two times a day (BID) | ORAL | 0 refills | Status: DC

## 2017-03-01 NOTE — Telephone Encounter (Signed)
To be safe I rec ED or [facility] in [place]

## 2017-03-01 NOTE — Telephone Encounter (Signed)
Nurses please talk with patient find out what is his blood pressure readings? Also discuss his symptoms. May have to reduce the dose of the medicine. May need blood work.

## 2017-03-01 NOTE — Telephone Encounter (Signed)
Pt called stating that ever since he started the new thyroid medication he has been feeling poorly. Pt states that the past couple days he has felt like his food is not going down even though it is and states that his bp has been elevated. Pt has an appt on Monday, but wants someone to call him if he should be alarmed. Please advise.

## 2017-03-01 NOTE — Telephone Encounter (Signed)
error 

## 2017-03-01 NOTE — Telephone Encounter (Signed)
Pt states he has been having symptoms of indigestion since starting thyroid med in July. Feels like food is sitting in stomach. He ate a meat ball today and started not feeling right. Called his friend who works at [employer] and he took his bp with a normal cuff did not have a big one. 150/98. No headache or chest pain. Felt like the meat ball was stuck in throat and felt like he was going to vomit but did not. Symptoms lasted a few mins went away after resting. Took some tums. Feels some better but not back to normal.

## 2017-03-01 NOTE — Telephone Encounter (Signed)
Discussed with pt. Pt agreed to have wife take him directly to ED.

## 2017-03-01 NOTE — ED Provider Notes (Signed)
[facility] DEPT Provider Note   CSN: [id number] Arrival date & time: 03/01/17  1554     History   Chief Complaint Chief Complaint  Patient presents with  . Chest Pain    HPI John Terrell is a 41 y.o. male.   Chest Pain      Pt was seen at 1845.  Per pt, c/o gradual onset and worsening of persistent feeling of "indigestion" and "food getting hung up" for the past 2 months. Pt describes this as a "burning sensation" in his mid-epigastric area. Pt states he ate a meatball today approximately 1330/1400 and "it got hung up" in his lower esophagus. Pt states "it was just sitting there." Pt states he then began to have several episodes of vomiting "trying to move it out" and when he was vomiting he felt "sweaty" and "SOB." Pt then called his doctor and was told to come to the ED for evaluation. Pt states he also had his thyroid medication was changed 2 months ago before his symptoms began. Denies diarrhea, no black or blood in stools, no CP/palpitations, no cough, no back pain, no abd pain, no black or blood in stools or emesis.   Past Medical History:  Diagnosis Date  . Hyperlipidemia     Patient Active Problem List   Diagnosis Date Noted  . Elevated liver enzymes 09/11/2015  . Elevated TSH 09/11/2015  . Impaired fasting glucose 12/05/2014  . Hyperlipidemia LDL goal <130 06/07/2014  . Morbid obesity (HCC) 06/07/2014    Past Surgical History:  Procedure Laterality Date  . HERNIA REPAIR  2008 - 2009       Home Medications    Prior to Admission medications   Medication Sig Start Date End Date Taking? Authorizing Provider  Cholecalciferol (VITAMIN D3) 5000 UNITS CAPS Take by mouth daily.   Yes [provider]  Cyanocobalamin (VITAMIN B12 PO) Take by mouth.   Yes [provider]  levothyroxine (SYNTHROID, LEVOTHROID) 50 MCG tablet Take 1 tablet (50 mcg total) by mouth daily. 01/02/17  Yes Merlyn Albert, MD  Multiple Vitamin (MULTIVITAMIN) tablet  Take 1 tablet by mouth daily.   Yes [provider]  Omega-3 Fatty Acids (FISH OIL) 1000 MG CAPS Take by mouth. Take 5 daily   Yes [provider]  simvastatin (ZOCOR) 20 MG tablet Take 1 tablet (20 mg total) by mouth daily. 01/02/17  Yes Merlyn Albert, MD    Family History Family History  Problem Relation Age of Onset  . Heart disease Father   . Heart disease Maternal Grandfather   . Heart disease Paternal Grandfather     Social History Social History  Substance Use Topics  . Smoking status: Never Smoker  . Smokeless tobacco: Never Used  . Alcohol use Not on file     Allergies   Amoxil [amoxicillin]   Review of Systems Review of Systems  Cardiovascular: Positive for chest pain.  ROS: Statement: All systems negative except as marked or noted in the HPI; Constitutional: Negative for fever and chills. ; ; Eyes: Negative for eye pain, redness and discharge. ; ; ENMT: Negative for ear pain, hoarseness, nasal congestion, sinus pressure and sore throat. ; ; Cardiovascular: Negative for chest pain, palpitations, and peripheral edema. ; ; Respiratory: +SOB. Negative for cough, wheezing and stridor. ; ; Gastrointestinal: +N/V, esoph FB sensation. Negative for diarrhea, abdominal pain, blood in stool, hematemesis, jaundice and rectal bleeding. . ; ; Genitourinary: Negative for dysuria, flank pain and hematuria. ; ;  Musculoskeletal: Negative for back pain and neck pain. Negative for swelling and trauma.; ; Skin: +diaphoresis. Negative for pruritus, rash, abrasions, blisters, bruising and skin lesion.; ; Neuro: Negative for headache, lightheadedness and neck stiffness. Negative for weakness, altered level of consciousness, altered mental status, extremity weakness, paresthesias, involuntary movement, seizure and syncope.       Physical Exam Updated Vital Signs BP 133/80   Pulse 70   Temp 98.7 F (37.1 C) (Oral)   Resp (!) 26   Ht  (1.905 m)   Wt (!) 175.5 kg  (387 lb)   SpO2 99%   BMI 48.37 kg/m   Physical Exam 1850; Physical examination:  Nursing notes reviewed; Vital signs and O2 SAT reviewed;  Constitutional: Well developed, Well nourished, Well hydrated, In no acute distress; Head:  Normocephalic, atraumatic; Eyes: EOMI, PERRL, No scleral icterus; ENMT: Mouth and pharynx normal, Mucous membranes moist; Neck: Supple, Full range of motion, No lymphadenopathy; Cardiovascular: Regular rate and rhythm, No gallop; Respiratory: Breath sounds clear & equal bilaterally, No wheezes.  Speaking full sentences with ease, Normal respiratory effort/excursion; Chest: Nontender, Movement normal; Abdomen: Soft, Nontender, Nondistended, Normal bowel sounds; Genitourinary: No CVA tenderness; Extremities: Pulses normal, No tenderness, No edema, No calf edema or asymmetry.; Neuro: AA&Ox3, Major CN grossly intact.  Speech clear. No gross focal motor or sensory deficits in extremities.; Skin: Color normal, Warm, Dry.   ED Treatments / Results  Labs (all labs ordered are listed, but only abnormal results are displayed)   EKG  EKG Interpretation  Date/Time:  Friday March 01 2017 16:31:00 EDT Ventricular Rate:  67 PR Interval:  146 QRS Duration: 82 QT Interval:  412 QTC Calculation: 435 R Axis:   13 Text Interpretation:  Normal sinus rhythm Normal ECG No old tracing to compare Confirmed by Samuel Jester [phone]) on 03/01/2017 6:58:21 PM       Radiology   Procedures Procedures (including critical care time)  Medications Ordered in ED Medications - No data to display   Initial Impression / Assessment and Plan / ED Course  I have reviewed the triage vital signs and the nursing notes.  Pertinent labs & imaging results that were available during my care of the patient were reviewed by me and considered in my medical decision making (see chart for details).  MDM Reviewed: previous chart, nursing note and vitals Reviewed previous: labs and  ECG Interpretation: labs, x-ray and ECG    Results for orders placed or performed during the hospital encounter of 03/01/17  Basic metabolic panel  Result Value Ref Range   Sodium 141 135 - 145 mmol/L   Potassium 4.1 3.5 - 5.1 mmol/L   Chloride 105 101 - 111 mmol/L   CO2 29 22 - 32 mmol/L   Glucose, Bld 101 (H) 65 - 99 mg/dL   BUN 12 6 - 20 mg/dL   Creatinine, Ser 6.04 0.61 - 1.24 mg/dL   Calcium 8.6 (L) 8.9 - 10.3 mg/dL   GFR calc non Af Amer >60 >60 mL/min   GFR calc Af Amer >60 >60 mL/min   Anion gap 7 5 - 15  CBC  Result Value Ref Range   WBC 9.8 4.0 - 10.5 K/uL   RBC 4.61 4.22 - 5.81 MIL/uL   Hemoglobin 12.5 (L) 13.0 - 17.0 g/dL   HCT 54.0 (L) 98.1 - 19.1 %   MCV 84.2 78.0 - 100.0 fL   MCH 27.1 26.0 - 34.0 pg   MCHC 32.2 30.0 - 36.0 g/dL  RDW 13.4 11.5 - 15.5 %   Platelets 202 150 - 400 K/uL  Troponin I  Result Value Ref Range   Troponin I <0.03 <0.03 ng/mL  Lipase, blood  Result Value Ref Range   Lipase 27 11 - 51 U/L  Hepatic function panel  Result Value Ref Range   Total Protein 6.4 (L) 6.5 - 8.1 g/dL   Albumin 3.2 (L) 3.5 - 5.0 g/dL   AST 24 15 - 41 U/L   ALT 20 17 - 63 U/L   Alkaline Phosphatase 69 38 - 126 U/L   Total Bilirubin 0.6 0.3 - 1.2 mg/dL   Bilirubin, Direct 0.1 0.1 - 0.5 mg/dL   Indirect Bilirubin 0.5 0.3 - 0.9 mg/dL  Troponin I  Result Value Ref Range   Troponin I <0.03 <0.03 ng/mL   Dg Chest 2 View Result Date: 03/01/2017 CLINICAL DATA:  Chest pain and short of breath EXAM: CHEST  2 VIEW COMPARISON:  None. FINDINGS: Normal cardiac silhouette. No effusion, infiltrate pneumothorax. Mild central venous congestion. No acute osseous abnormality. IMPRESSION: Mild central venous congestion. No pneumonia, pulmonary edema, or pleural fluid. Electronically Signed   By: Genevive BiStewart  Edmunds M.D.   On: 03/01/2017 16:46    2130:  Doubt PE as cause for symptoms with low risk Wells.  Doubt ACS as cause for symptoms with normal troponin and EKG without acute  STTW changes after 6+ hours of constant atypical symptoms. Pt feels better after GI cocktail and wants to go home now. Pt has tol PO well without N/V or regurgitating. Pt will need GI f/u.  Dx and testing d/w pt and family.  Questions answered.  Verb understanding, agreeable to d/c home with outpt f/u.    Final Clinical Impressions(s) / ED Diagnoses   Final diagnoses:  None    New Prescriptions New Prescriptions   No medications on file      Samuel JesterMcManus, Daaron Dimarco, DO 03/04/17 [id number]

## 2017-03-01 NOTE — Discharge Instructions (Signed)
Eat a bland diet, avoiding greasy, fatty, fried foods, as well as spicy and acidic foods or beverages.  Avoid eating within the hour or 2 before going to bed or laying down.  Also avoid teas, colas, coffee, chocolate, pepermint and spearment.  Take the prescription as directed.  May also take over the counter maalox/mylanta, as directed on packaging, as needed for discomfort.  Call your regular medical doctor and the GI doctor on Monday to schedule a follow up appointment this week.  Return to the Emergency Department immediately if worsening.

## 2017-03-01 NOTE — ED Triage Notes (Signed)
Patient states he had an episode of chest pain and shortness of breath after eating lunch today. Patient states she has started having indigestion since changing his thyroid medication. Patient denies pain, SoB, nausea, vomiting. Patient was advised to come in by his PCP.

## 2017-03-04 ENCOUNTER — Encounter: Payer: Self-pay | Admitting: Family Medicine

## 2017-03-04 ENCOUNTER — Ambulatory Visit (INDEPENDENT_AMBULATORY_CARE_PROVIDER_SITE_OTHER): Payer: PRIVATE HEALTH INSURANCE | Admitting: Family Medicine

## 2017-03-04 VITALS — BP 122/80 | Ht 73.0 in | Wt 377.2 lb

## 2017-03-04 DIAGNOSIS — R946 Abnormal results of thyroid function studies: Secondary | ICD-10-CM | POA: Diagnosis not present

## 2017-03-04 DIAGNOSIS — R1310 Dysphagia, unspecified: Secondary | ICD-10-CM

## 2017-03-04 DIAGNOSIS — E038 Other specified hypothyroidism: Secondary | ICD-10-CM

## 2017-03-04 DIAGNOSIS — K2100 Gastro-esophageal reflux disease with esophagitis, without bleeding: Secondary | ICD-10-CM | POA: Insufficient documentation

## 2017-03-04 DIAGNOSIS — E039 Hypothyroidism, unspecified: Secondary | ICD-10-CM | POA: Insufficient documentation

## 2017-03-04 DIAGNOSIS — R7989 Other specified abnormal findings of blood chemistry: Secondary | ICD-10-CM

## 2017-03-04 MED ORDER — SUCRALFATE 1 G PO TABS: ORAL_TABLET | ORAL | 0 refills | Status: DC

## 2017-03-04 MED ORDER — ONDANSETRON 4 MG PO TBDP: ORAL_TABLET | ORAL | 0 refills | Status: DC

## 2017-03-04 MED ORDER — PANTOPRAZOLE SODIUM 40 MG PO TBEC: 40.0000 mg | DELAYED_RELEASE_TABLET | Freq: Every day | ORAL | 5 refills | Status: DC

## 2017-03-04 NOTE — Progress Notes (Signed)
   Subjective:    Patient ID: John Terrell, male    DOB: 03/02/1976, 41 y.o.   MRN: [id number]  HPI Patient in today for possible GERD. Patient states he was seen at the ER on Friday and today that he has probable GERD  . Has concerns of symptoms coinciding with Levothyroxine. States symptoms starting around time he started medication.   No hx of acid reflux  Feels sensation almost like things got stuck but it didn't  Rare indigestion three or four times in his life  Drinks a lot of caffeine  No tea no coffee   Complete hospital record reviewed in presence of patient.  Patient is very impressive episode on Friday. Ayden meatball. Felt like it got stuck. Proceeded to vomit multiple times. Deep substernal chest pain. Broke out in sweat. Felt very sick and acutely in trouble. Went to emergency room seen given GI cocktail with substantial improvement of pain. Symptoms abated started on Pepcid  Review of Systems No headache, no major weight loss or weight gain, no chest pain no back pain abdominal pain no change in bowel habits complete ROS otherwise negative     Objective:   Physical Exam Alert and oriented, vitals reviewed and stable, NAD ENT-TM's and ext canals WNL bilat via otoscopic exam Soft palate, tonsils and post pharynx WNL via oropharyngeal exam Neck-symmetric, no masses; thyroid nonpalpable and nontender Pulmonary-no tachypnea or accessory muscle use; Clear without wheezes via auscultation Card--no abnrml murmurs, rhythm reg and rate WNL Carotid pulses symmetric, without bruits Mild epigastric tenderness       Assessment & Plan:  Impression 1 reflux esophagitis/gastritis with element of solid food dysphagia long conversation held. Patient needs upper endoscopy. Also more aggressive acid blockade at this point. Discussed will initiate. Encouraged to cut down caffeine next  #2 morbid obesity patient now plugged into weight [facility] and clinic provide 3 placement  appointment  #3 hypothyroidism time to check TSH patient will proceed

## 2017-03-05 ENCOUNTER — Encounter: Payer: Self-pay | Admitting: Family Medicine

## 2017-03-06 ENCOUNTER — Encounter (INDEPENDENT_AMBULATORY_CARE_PROVIDER_SITE_OTHER): Payer: Self-pay | Admitting: Internal Medicine

## 2017-03-06 ENCOUNTER — Encounter: Payer: Self-pay | Admitting: Family Medicine

## 2017-03-08 ENCOUNTER — Encounter (HOSPITAL_COMMUNITY): Payer: Self-pay

## 2017-03-08 ENCOUNTER — Telehealth: Payer: Self-pay | Admitting: *Deleted

## 2017-03-08 ENCOUNTER — Ambulatory Visit (HOSPITAL_COMMUNITY): Admit: 2017-03-08 | Payer: PRIVATE HEALTH INSURANCE | Admitting: Internal Medicine

## 2017-03-08 ENCOUNTER — Telehealth (INDEPENDENT_AMBULATORY_CARE_PROVIDER_SITE_OTHER): Payer: Self-pay | Admitting: Internal Medicine

## 2017-03-08 SURGERY — EGD (ESOPHAGOGASTRODUODENOSCOPY)
Anesthesia: Moderate Sedation

## 2017-03-08 NOTE — Telephone Encounter (Signed)
done

## 2017-03-08 NOTE — Telephone Encounter (Signed)
Dr. Brett Canales spoke with Dr. Karilyn Cota about pt and Dr. Brett Canales wants to speak with pt. I left message with pt to call back to speak with dr Brett Canales

## 2017-03-08 NOTE — Telephone Encounter (Signed)
Pt called and spoke with dr Brett Canales

## 2017-03-08 NOTE — Telephone Encounter (Signed)
Dr. Lubertha South contacted me about John Terrell's ongoing problem with dysphagia.Marland KitchenHe was seen in emergency room 1 week ago when he had food impaction and chest pain and had spontaneous relief. I talked with patient over the phone. Will schedule patient for EGD with dilation on 03/11/2017.

## 2017-03-11 ENCOUNTER — Other Ambulatory Visit (INDEPENDENT_AMBULATORY_CARE_PROVIDER_SITE_OTHER): Payer: Self-pay | Admitting: Internal Medicine

## 2017-03-11 ENCOUNTER — Encounter (HOSPITAL_COMMUNITY): Payer: Self-pay

## 2017-03-11 ENCOUNTER — Ambulatory Visit (HOSPITAL_COMMUNITY)
Admission: RE | Admit: 2017-03-11 | Discharge: 2017-03-11 | Disposition: A | Discharge status: Home / Self Care | Payer: PRIVATE HEALTH INSURANCE | Source: Ambulatory Visit | Attending: Internal Medicine | Admitting: Internal Medicine

## 2017-03-11 ENCOUNTER — Encounter (HOSPITAL_COMMUNITY)
Admission: RE | Disposition: A | Discharge status: Home / Self Care | Payer: Self-pay | Source: Ambulatory Visit | Attending: Internal Medicine

## 2017-03-11 ENCOUNTER — Ambulatory Visit (HOSPITAL_COMMUNITY): Admit: 2017-03-11 | Payer: PRIVATE HEALTH INSURANCE | Admitting: Internal Medicine

## 2017-03-11 DIAGNOSIS — E785 Hyperlipidemia, unspecified: Secondary | ICD-10-CM | POA: Insufficient documentation

## 2017-03-11 DIAGNOSIS — Z8249 Family history of ischemic heart disease and other diseases of the circulatory system: Secondary | ICD-10-CM | POA: Insufficient documentation

## 2017-03-11 DIAGNOSIS — R1314 Dysphagia, pharyngoesophageal phase: Secondary | ICD-10-CM | POA: Diagnosis present

## 2017-03-11 DIAGNOSIS — Z79899 Other long term (current) drug therapy: Secondary | ICD-10-CM | POA: Diagnosis not present

## 2017-03-11 DIAGNOSIS — Z880 Allergy status to penicillin: Secondary | ICD-10-CM | POA: Diagnosis not present

## 2017-03-11 DIAGNOSIS — E755 Other lipid storage disorders: Secondary | ICD-10-CM | POA: Insufficient documentation

## 2017-03-11 DIAGNOSIS — R12 Heartburn: Secondary | ICD-10-CM | POA: Insufficient documentation

## 2017-03-11 DIAGNOSIS — R1310 Dysphagia, unspecified: Secondary | ICD-10-CM | POA: Insufficient documentation

## 2017-03-11 DIAGNOSIS — K219 Gastro-esophageal reflux disease without esophagitis: Secondary | ICD-10-CM | POA: Diagnosis not present

## 2017-03-11 DIAGNOSIS — K317 Polyp of stomach and duodenum: Secondary | ICD-10-CM | POA: Diagnosis not present

## 2017-03-11 DIAGNOSIS — E039 Hypothyroidism, unspecified: Secondary | ICD-10-CM | POA: Diagnosis not present

## 2017-03-11 HISTORY — PX: ESOPHAGEAL DILATION: SHX303

## 2017-03-11 HISTORY — DX: Gastro-esophageal reflux disease without esophagitis: K21.9

## 2017-03-11 HISTORY — PX: ESOPHAGOGASTRODUODENOSCOPY: SHX5428

## 2017-03-11 SURGERY — EGD (ESOPHAGOGASTRODUODENOSCOPY)
Anesthesia: Moderate Sedation

## 2017-03-11 MED ORDER — EPINEPHRINE PF 1 MG/10ML IJ SOSY
PREFILLED_SYRINGE | INTRAMUSCULAR | Status: AC
  Filled 2017-03-11: qty 10

## 2017-03-11 MED ORDER — MIDAZOLAM HCL 5 MG/5ML IJ SOLN
INTRAMUSCULAR | Status: DC | PRN
  Administered 2017-03-11 (×5): 2 mg via INTRAVENOUS

## 2017-03-11 MED ORDER — ATROPINE SULFATE 1 MG/ML IJ SOLN
INTRAMUSCULAR | Status: AC
  Filled 2017-03-11: qty 1

## 2017-03-11 MED ORDER — MEPERIDINE HCL 50 MG/ML IJ SOLN
INTRAMUSCULAR | Status: AC
  Filled 2017-03-11: qty 1

## 2017-03-11 MED ORDER — FLUMAZENIL 0.5 MG/5ML IV SOLN
INTRAVENOUS | Status: AC
  Filled 2017-03-11: qty 5

## 2017-03-11 MED ORDER — NALOXONE HCL 0.4 MG/ML IJ SOLN
INTRAMUSCULAR | Status: AC
  Filled 2017-03-11: qty 1

## 2017-03-11 MED ORDER — LIDOCAINE VISCOUS 2 % MT SOLN
OROMUCOSAL | Status: DC | PRN
Start: 2017-03-11 — End: 2017-03-11
  Administered 2017-03-11: 5 mL via OROMUCOSAL

## 2017-03-11 MED ORDER — STERILE WATER FOR IRRIGATION IR SOLN
Status: DC | PRN
  Administered 2017-03-11: 2.5 mL

## 2017-03-11 MED ORDER — MEPERIDINE HCL 50 MG/ML IJ SOLN
INTRAMUSCULAR | Status: DC | PRN
  Administered 2017-03-11 (×4): 25 mg via INTRAVENOUS

## 2017-03-11 MED ORDER — SODIUM CHLORIDE 0.9 % IV SOLN
INTRAVENOUS | Status: DC
  Administered 2017-03-11: 20 mL/h via INTRAVENOUS

## 2017-03-11 MED ORDER — MIDAZOLAM HCL 5 MG/5ML IJ SOLN
INTRAMUSCULAR | Status: AC
  Filled 2017-03-11: qty 10

## 2017-03-11 MED ORDER — SODIUM CHLORIDE 0.9 % IR SOLN: 1000.0000 mL | Freq: Once | Status: DC

## 2017-03-11 MED ORDER — LIDOCAINE VISCOUS 2 % MT SOLN
OROMUCOSAL | Status: AC
  Filled 2017-03-11: qty 15

## 2017-03-11 NOTE — Discharge Instructions (Signed)
Discontinue Sucralfate. Resume other medications as before. Resume usual diet. No driving for 24 hours. Physician will call with biopsy results. Gastrointestinal Endoscopy, Care After Refer to this sheet in the next few weeks. These instructions provide you with information about caring for yourself after your procedure. Your health care provider may also give you more specific instructions. Your treatment has been planned according to current medical practices, but problems sometimes occur. Call your health care provider if you have any problems or questions after your procedure. What can I expect after the procedure? After your procedure, it is common to feel:  Bloated.  Soreness in your throat.  Sleepy.  Follow these instructions at home:  Do not drive for 24 hours if you received a if you received a medicine to help you relax (sedative).  Avoid drinking warm beverages and alcohol for the first 24 hours after the procedure.  Take over-the-counter and prescription medicines only as told by your health care provider.  Drink enough fluids to keep your urine clear or pale yellow.  If you feel bloated, try going for a walk. Walking may help the feeling go away.  If your throat is sore, try gargling with salt water. Get help right away if:  You have severe nausea or vomiting.  You have severe abdominal pain, abdominal cramps that last longer than 6 hours, or abdominal swelling.  You have severe shoulder or back pain.  You have trouble swallowing.  You have shortness of breath, your breathing is shallow, or you breathing is faster than normal.  You have a fever.  Your heart is beating very fast.  You vomit blood or material that looks like coffee grounds.  You have bloody, black, or tarry stools. This information is not intended to replace advice given to you by your health care provider. Make sure you discuss any questions you have with your health care  provider. Document Released: 01/24/2004 Document Revised: 04/18/2016 Document Reviewed: 04/03/2015 Elsevier Interactive Patient Education  2018 [employer].

## 2017-03-11 NOTE — H&P (Signed)
John Terrell is an 41 y.o. male.   Chief Complaint: Patient is here for EGD and ED. HPI: patient is 86 year [place] male who presents with one month history of intermittent heartburn who experienced an episode f food impactionnover week ago and was seen in emergency room. e has had difficulty primarily with solids.Marland Kitchen He points or sternal areas of bo full liquids. He states he has lost 17 po unds. She denies  Epigastric pain or melena.He rarely takes Advil. No recent use of doxycycline.  Past Medical History:  Diagnosis Date  . GERD (gastroesophageal reflux disease)     hypothyroidism   . Hyperlipidemia     Past Surgical History:  Procedure Laterality Date  . HERNIA REPAIR  2008 - 2009    Family History  Problem Relation Age of Onset  . Heart disease Father   . Heart disease Maternal Grandfather   . Heart disease Paternal Grandfather    Social History:  reports that he has never smoked. He has never used smokeless tobacco. He reports that he drinks alcohol. He reports that he does not use drugs.  Allergies:  Allergies  Allergen Reactions  . Amoxil [Amoxicillin]     Rash, can take cephalosporin    Medications Prior to Admission  Medication Sig Dispense Refill  . Cholecalciferol (VITAMIN D3) 5000 UNITS CAPS Take by mouth daily.    . Cyanocobalamin (VITAMIN B12 PO) Take by mouth.    . levothyroxine (SYNTHROID, LEVOTHROID) 50 MCG tablet Take 1 tablet (50 mcg total) by mouth daily. 90 tablet 1  . Multiple Vitamin (MULTIVITAMIN) tablet Take 1 tablet by mouth daily.    . Omega-3 Fatty Acids (FISH OIL) 1000 MG CAPS Take by mouth. Take 5 daily    . ondansetron (ZOFRAN ODT) 4 MG disintegrating tablet Take 1 tablet by mouth every 6 hours as needed. 16 tablet 0  . pantoprazole (PROTONIX) 40 MG tablet Take 1 tablet (40 mg total) by mouth daily. 30 tablet 5  . simvastatin (ZOCOR) 20 MG tablet Take 1 tablet (20 mg total) by mouth daily. 90 tablet 1  . sucralfate (CARAFATE) 1 g tablet  Take 1 tablet by mouth before meals. 60 tablet 0    No results found for this or any previous visit (from the past 48 hour(s)). No results found.  ROS  Blood pressure (!) 142/92, pulse 79, temperature 98.6 F (37 C), temperature source Oral, resp. rate 14, SpO2 99 %. Physical Exam  Constitutional:  WD obese Caucasian male in NAD.  HENT:  Mouth/Throat: Oropharynx is clear and moist.  Eyes: Conjunctivae are normal. No scleral icterus.  Neck: No thyromegaly present.  Cardiovascular: Normal rate, regular rhythm and normal heart sounds.   No murmur heard. Respiratory: Effort normal and breath sounds normal.  GI:  Abdomen is full, soft and nontender without organomegaly or masses.  Musculoskeletal: He exhibits no edema.  Lymphadenopathy:    He has no cervical adenopathy.  Neurological: He is alert.  Skin: Skin is warm and dry.     Assessment/Plan Esophageal dysphagia. Recent onset of heartburn. EGD with ED.  Lionel December, MD 03/11/2017, 9:33 AM

## 2017-03-11 NOTE — Op Note (Signed)
[facility] Patient Name: John Terrell Procedure Date: 03/11/2017 8:33 AM MRN: [id number] Date of Birth: [birth year] Attending MD: Lionel December , MD CSN: [id number] Age: 41 Admit Type: Outpatient Procedure:                Upper GI endoscopy Indications:              Esophageal dysphagia Providers:                Lionel December, MD, [place] Scrape RN, RN,                            Edrick Kins, RN Referring MD:             Vilinda Blanks. Gerda Diss, MD Medicines:                Lidocaine spray, Meperidine 100 mg IV, Midazolam 10                            mg IV Complications:            No immediate complications                           . Estimated Blood Loss:     Estimated blood loss was minimal. Procedure:                Pre-Anesthesia Assessment:                           - Prior to the procedure, a History and Physical                            was performed, and patient medications and                            allergies were reviewed. The patient's tolerance of                            previous anesthesia was also reviewed. The risks                            and benefits of the procedure and the sedation                            options and risks were discussed with the patient.                            All questions were answered, and informed consent                            was obtained. Prior Anticoagulants: The patient has                            taken no previous anticoagulant or antiplatelet                            agents. ASA Grade  Assessment: II - A patient with                            mild systemic disease. After reviewing the risks                            and benefits, the patient was deemed in                            satisfactory condition to undergo the procedure.                           After obtaining informed consent, the endoscope was                            passed under direct vision. Throughout the        procedure, the patient's blood pressure, pulse, and                            oxygen saturations were monitored continuously. The                            EG-[id number] ([id number]) scope was introduced through the                            mouth, and advanced to the second part of duodenum.                            The upper GI endoscopy was accomplished without                            difficulty. The patient tolerated the procedure                            well. Scope In: 9:48:17 AM Scope Out: 10:06:03 AM Total Procedure Duration: 0 hours 17 minutes 46 seconds  Findings:      The examined esophagus was normal.      No endoscopic abnormality was evident in the esophagus to explain the       patient's complaint of dysphagia. It was decided, however, to proceed       with dilation of the entire esophagus. The scope was withdrawn. Dilation       was performed with a Maloney dilator with no resistance at 54 Fr. The       dilation site was examined following endoscope reinsertion and showed no       change and no bleeding, mucosal tear or perforation. Biopsies were taken       with a cold forceps for histology. The pathology specimen was placed       into Bottle Number 2.      The Z-line was regular and was found 43 cm from the incisors.      A single small sessile polyp with no stigmata of recent bleeding was       found in the gastric body. Biopsies were taken with a cold forceps for  histology. The pathology specimen was placed into Bottle Number 1.      The exam of the stomach was otherwise normal.      The duodenal bulb and second portion of the duodenum were normal. Impression:               - Normal esophagus.                           - No endoscopic esophageal abnormality to explain                            patient's dysphagia. Esophagus dilated. Dilated.                            Biopsied.                           - Z-line regular, 43 cm from the incisors.                            - A single gastric polyp. Biopsied.                           - Normal duodenal bulb and second portion of the                            duodenum. Moderate Sedation:      Moderate (conscious) sedation was administered by the endoscopy nurse       and supervised by the endoscopist. The following parameters were       monitored: oxygen saturation, heart rate, blood pressure, CO2       capnography and response to care. Total physician intraservice time was       26 minutes. Recommendation:           - Patient has a contact number available for                            emergencies. The signs and symptoms of potential                            delayed complications were discussed with the                            patient. Return to normal activities tomorrow.                            Written discharge instructions were provided to the                            patient.                           - Resume previous diet today.                           - Continue present medications but discontinue  Sucralfate.                           - Await pathology results. Procedure Code(s):        --- Professional ---                           [phone], Esophagogastroduodenoscopy, flexible,                            transoral; with biopsy, single or multiple                           43450, Dilation of esophagus, by unguided sound or                            bougie, single or multiple passes                           99152, Moderate sedation services provided by the                            same physician or other qualified health care                            professional performing the diagnostic or                            therapeutic service that the sedation supports,                            requiring the presence of an independent trained                            observer to assist in the monitoring of the                             patient's level of consciousness and physiological                            status; initial 15 minutes of intraservice time,                            patient age 14 years or older                           [phone], Moderate sedation services; each additional                            15 minutes intraservice time Diagnosis Code(s):        --- Professional ---                           K31.7, Polyp of stomach and duodenum  R13.14, Dysphagia, pharyngoesophageal phase CPT copyright 2016 [employer]. All rights reserved. The codes documented in this report are preliminary and upon coder review may  be revised to meet current compliance requirements. Lionel December, MD Lionel December, MD 03/11/2017 10:19:16 AM This report has been signed electronically. Number of Addenda: 0

## 2017-03-13 ENCOUNTER — Encounter (HOSPITAL_COMMUNITY): Payer: Self-pay | Admitting: Internal Medicine

## 2017-03-14 ENCOUNTER — Telehealth (INDEPENDENT_AMBULATORY_CARE_PROVIDER_SITE_OTHER): Payer: Self-pay | Admitting: Internal Medicine

## 2017-03-14 NOTE — Telephone Encounter (Signed)
Message was given to Dr.Rehman

## 2017-03-14 NOTE — Telephone Encounter (Signed)
Patient's call returned. Symptoms that he had 2 days ago i.e. nausea and low-grade fever have resolved. He feels well today. He has not had any swallowing difficulty. He will continue PPI for GERD. He will monitor his symptom of dysphagia and if it becomes frequent occurrence he will call us and we will proceed with further testing.

## 2017-03-14 NOTE — Telephone Encounter (Signed)
Patient called at 6:06pm yesterday, lmoam stating he was returning Dr. Patty Sermons call from about 15 minutes ago.  Stated he was sorry he missed his call, he was dealing with a sick child.  He stated Dr. Karilyn Cota told him to return the call.  The patient asked that he please call him back today.  [phone]

## 2017-03-23 LAB — TSH: TSH: 6.35 u[IU]/mL — AB (ref 0.450–4.500)

## 2017-03-25 ENCOUNTER — Encounter (INDEPENDENT_AMBULATORY_CARE_PROVIDER_SITE_OTHER): Payer: Self-pay | Admitting: Internal Medicine

## 2017-03-26 ENCOUNTER — Other Ambulatory Visit: Payer: Self-pay | Admitting: *Deleted

## 2017-03-26 DIAGNOSIS — E039 Hypothyroidism, unspecified: Secondary | ICD-10-CM

## 2017-03-26 MED ORDER — LEVOTHYROXINE SODIUM 75 MCG PO TABS: 75.0000 ug | ORAL_TABLET | Freq: Every day | ORAL | 1 refills | Status: DC

## 2017-03-29 ENCOUNTER — Ambulatory Visit (INDEPENDENT_AMBULATORY_CARE_PROVIDER_SITE_OTHER): Payer: PRIVATE HEALTH INSURANCE | Admitting: Internal Medicine

## 2017-04-08 ENCOUNTER — Ambulatory Visit (INDEPENDENT_AMBULATORY_CARE_PROVIDER_SITE_OTHER): Payer: PRIVATE HEALTH INSURANCE | Admitting: Internal Medicine

## 2017-05-10 ENCOUNTER — Encounter: Payer: Self-pay | Admitting: Family Medicine

## 2017-05-15 ENCOUNTER — Encounter: Payer: Self-pay | Admitting: Family Medicine

## 2017-05-15 ENCOUNTER — Ambulatory Visit: Payer: PRIVATE HEALTH INSURANCE | Admitting: Family Medicine

## 2017-05-15 VITALS — BP 128/84 | Temp 98.5°F | Ht 73.0 in | Wt 374.0 lb

## 2017-05-15 DIAGNOSIS — R5383 Other fatigue: Secondary | ICD-10-CM | POA: Diagnosis not present

## 2017-05-15 DIAGNOSIS — R252 Cramp and spasm: Secondary | ICD-10-CM

## 2017-05-15 DIAGNOSIS — R1011 Right upper quadrant pain: Secondary | ICD-10-CM | POA: Diagnosis not present

## 2017-05-15 DIAGNOSIS — R110 Nausea: Secondary | ICD-10-CM | POA: Diagnosis not present

## 2017-05-15 DIAGNOSIS — D649 Anemia, unspecified: Secondary | ICD-10-CM

## 2017-05-15 DIAGNOSIS — R7301 Impaired fasting glucose: Secondary | ICD-10-CM

## 2017-05-15 MED ORDER — ONDANSETRON 4 MG PO TBDP: 4.0000 mg | ORAL_TABLET | Freq: Four times a day (QID) | ORAL | 0 refills | Status: DC | PRN

## 2017-05-15 MED ORDER — ONDANSETRON 4 MG PO TBDP: 4.0000 mg | ORAL_TABLET | Freq: Four times a day (QID) | ORAL | 2 refills | Status: DC | PRN

## 2017-05-15 NOTE — Progress Notes (Signed)
   Subjective:    Patient ID: John Terrell, male    DOB: 04/13/1976, 41 y.o.   MRN: [id number]  HPIMuscle cramps in legs. Started before September. Tried mustard.   Nausea in the morning. Vomits sometimes in the morning. Started protonix in sept. Nausea was happening before that.   Nausea ongoing, many mornings to the point of feeling nause and ueaziness   Nausea ast couple hrs, last for til mid  9 or 10  A m  Tired and fatigued the past couple months  Eve nausea can occur occasionally in eve after eating something    feelling no pain with the nausea coming on    Fatigue and tiredness wonderig if due to endcoscopy and other interventions   GB disease  Cramps calf and thighs, and sonetimes even in the core muscles of chest wall  mustartd has helped some  Up more frequently with thirst and urinating      Weakness and fatigue since September.     Review of Systems No headache, no major weight loss or weight gain, no chest pain no back pain abdominal pain no change in bowel habits complete ROS otherwise negative     Objective:   Physical Exam  Alert and oriented, vitals reviewed and stable, NAD ENT-TM's and ext canals WNL bilat via otoscopic exam Soft palate, tonsils and post pharynx WNL via oropharyngeal exam Neck-symmetric, no masses; thyroid nonpalpable and nontender Pulmonary-no tachypnea or accessory muscle use; Clear without wheezes via auscultation Card--no abnrml murmurs, rhythm reg and rate WNL Carotid pulses symmetric, without bruits No discrete tenderness mid abdomen slight discomfort right upper quadrant      Assessment & Plan:  Impression #1 fatigue protracted etiology unclear  2.  Reflux symptoms recent esophageal dilatation.  H. pylori has not been assessed  3.  Atypical right upper quadrant discomfort intermittently.  Patient still has gallbladder discussed plan appropriate blood work.  Zofran as needed for nausea.  Right upper quadrant  ultrasound.  Further recommendations based on results

## 2017-05-23 LAB — HEPATIC FUNCTION PANEL
ALK PHOS: 85 IU/L (ref 39–117)
ALT: 12 IU/L (ref 0–44)
AST: 12 IU/L (ref 0–40)
Albumin: 3.3 g/dL — ABNORMAL LOW (ref 3.5–5.5)
BILIRUBIN, DIRECT: 0.06 mg/dL (ref 0.00–0.40)
Bilirubin Total: 0.3 mg/dL (ref 0.0–1.2)
TOTAL PROTEIN: 5.5 g/dL — AB (ref 6.0–8.5)

## 2017-05-23 LAB — CBC WITH DIFFERENTIAL/PLATELET
Basophils Absolute: 0.1 10*3/uL (ref 0.0–0.2)
Basos: 1 %
EOS (ABSOLUTE): 0.3 10*3/uL (ref 0.0–0.4)
Eos: 6 %
Hematocrit: 33.9 % — ABNORMAL LOW (ref 37.5–51.0)
Hemoglobin: 11.1 g/dL — ABNORMAL LOW (ref 13.0–17.7)
IMMATURE GRANULOCYTES: 0 %
Immature Grans (Abs): 0 10*3/uL (ref 0.0–0.1)
LYMPHS ABS: 1.3 10*3/uL (ref 0.7–3.1)
Lymphs: 25 %
MCH: 25.6 pg — ABNORMAL LOW (ref 26.6–33.0)
MCHC: 32.7 g/dL (ref 31.5–35.7)
MCV: 78 fL — AB (ref 79–97)
MONOS ABS: 0.4 10*3/uL (ref 0.1–0.9)
Monocytes: 8 %
NEUTROS PCT: 60 %
Neutrophils Absolute: 3.1 10*3/uL (ref 1.4–7.0)
PLATELETS: 317 10*3/uL (ref 150–379)
RBC: 4.33 x10E6/uL (ref 4.14–5.80)
RDW: 15 % (ref 12.3–15.4)
WBC: 5.2 10*3/uL (ref 3.4–10.8)

## 2017-05-23 LAB — BASIC METABOLIC PANEL
BUN/Creatinine Ratio: 8 — ABNORMAL LOW (ref 9–20)
BUN: 8 mg/dL (ref 6–20)
CALCIUM: 8.5 mg/dL — AB (ref 8.7–10.2)
CHLORIDE: 108 mmol/L — AB (ref 96–106)
CO2: 27 mmol/L (ref 20–29)
Creatinine, Ser: 1.02 mg/dL (ref 0.76–1.27)
GFR, EST AFRICAN AMERICAN: 107 mL/min/{1.73_m2} (ref >59)
GFR, EST NON AFRICAN AMERICAN: 92 mL/min/{1.73_m2} (ref >59)
Glucose: 98 mg/dL (ref 65–99)
POTASSIUM: 4.3 mmol/L (ref 3.5–5.2)
SODIUM: 149 mmol/L — AB (ref 134–144)

## 2017-05-23 LAB — TSH: TSH: 6.08 u[IU]/mL — ABNORMAL HIGH (ref 0.450–4.500)

## 2017-05-23 LAB — MAGNESIUM: Magnesium: 2.1 mg/dL (ref 1.6–2.3)

## 2017-05-23 LAB — H PYLORI, IGM, IGG, IGA AB: H pylori, IgM Abs: <9 units (ref 0.0–8.9)

## 2017-05-23 LAB — HEMOGLOBIN A1C
Est. average glucose Bld gHb Est-mCnc: 114 mg/dL
Hgb A1c MFr Bld: 5.6 % (ref 4.8–5.6)

## 2017-05-24 MED ORDER — LEVOTHYROXINE SODIUM 100 MCG PO TABS
100.0000 ug | ORAL_TABLET | Freq: Every day | ORAL | 1 refills | Status: DC
Start: 2017-05-24 — End: 2017-08-14

## 2017-05-24 NOTE — Addendum Note (Signed)
Addended by: Margaretha SheffieldBROWN, Maxime Beckner S on: 05/24/2017 11:52 AM   Modules accepted: Orders

## 2017-05-28 ENCOUNTER — Ambulatory Visit (HOSPITAL_COMMUNITY)
Admission: RE | Admit: 2017-05-28 | Discharge: 2017-05-28 | Disposition: A | Discharge status: Home / Self Care | Payer: PRIVATE HEALTH INSURANCE | Source: Ambulatory Visit | Attending: Family Medicine | Admitting: Family Medicine

## 2017-05-28 DIAGNOSIS — K760 Fatty (change of) liver, not elsewhere classified: Secondary | ICD-10-CM | POA: Insufficient documentation

## 2017-05-28 DIAGNOSIS — R1011 Right upper quadrant pain: Secondary | ICD-10-CM | POA: Diagnosis present

## 2017-05-28 DIAGNOSIS — R110 Nausea: Secondary | ICD-10-CM | POA: Insufficient documentation

## 2017-05-29 ENCOUNTER — Encounter: Payer: Self-pay | Admitting: Family Medicine

## 2017-06-03 ENCOUNTER — Encounter (INDEPENDENT_AMBULATORY_CARE_PROVIDER_SITE_OTHER): Payer: Self-pay | Admitting: *Deleted

## 2017-06-04 ENCOUNTER — Ambulatory Visit (INDEPENDENT_AMBULATORY_CARE_PROVIDER_SITE_OTHER): Payer: PRIVATE HEALTH INSURANCE | Admitting: Internal Medicine

## 2017-06-12 ENCOUNTER — Encounter (INDEPENDENT_AMBULATORY_CARE_PROVIDER_SITE_OTHER): Payer: Self-pay | Admitting: Internal Medicine

## 2017-06-12 ENCOUNTER — Ambulatory Visit (INDEPENDENT_AMBULATORY_CARE_PROVIDER_SITE_OTHER): Payer: PRIVATE HEALTH INSURANCE | Admitting: Internal Medicine

## 2017-06-12 VITALS — BP 162/86 | HR 64 | Ht 75.0 in | Wt 379.2 lb

## 2017-06-12 DIAGNOSIS — D508 Other iron deficiency anemias: Secondary | ICD-10-CM | POA: Diagnosis not present

## 2017-06-12 NOTE — Patient Instructions (Signed)
Iron, Ferritin,  3 stools cards home with patient

## 2017-06-12 NOTE — Progress Notes (Signed)
   Subjective:    Patient ID: John Terrell, male    DOB: 05/14/1976, 41 y.o.   MRN: [id number]  HPI Referred by Dr Gerda DissLuking for anemia. Recent hemoglobin 11.1.  He states he has never been anemic in the past. He states he has not seen any blood on the tissue and stool  in over a year. He says he has an inflamed hemorrhoid. No unintentional weight loss.  His acid reflux is much better now since dietary changes.  Underwent an EGD in 2018 for dysphagia.  His appetite is good.      03/11/2017 EGD/ED dysphagia:   Impression:               - Normal esophagus.                           - No endoscopic esophageal abnormality to explain                            patient's dysphagia. Esophagus dilated. Dilated.                            Biopsied.                           - Z-line regular, 43 cm from the incisors.                           - A single gastric polyp. Biopsied.                           - Normal duodenal bulb and second portion of the                            duodenum.   CBC    Component Value Date/Time   WBC 5.2 05/21/2017 0817   WBC 9.8 03/01/2017 1706   RBC 4.33 05/21/2017 0817   RBC 4.61 03/01/2017 1706   HGB 11.1 (L) 05/21/2017 0817   HCT 33.9 (L) 05/21/2017 0817   PLT 317 05/21/2017 0817   MCV 78 (L) 05/21/2017 0817   MCH 25.6 (L) 05/21/2017 0817   MCH 27.1 03/01/2017 1706   MCHC 32.7 05/21/2017 0817   MCHC 32.2 03/01/2017 1706   RDW 15.0 05/21/2017 0817   LYMPHSABS 1.3 05/21/2017 0817   EOSABS 0.3 05/21/2017 0817   BASOSABS 0.1 05/21/2017 0817       Review of Systems     Objective:   Physical Exam Blood pressure (!) 162/86, pulse 64, height 6\' 3"  (1.905 m), weight (!) 379 lb 3.2 oz (172 kg). Alert and oriented. Skin warm and dry. Oral mucosa is moist.   . Sclera anicteric, conjunctivae is pink. Thyroid not enlarged. No cervical lymphadenopathy. Lungs clear. Heart regular rate and rhythm.  Abdomen is soft. Bowel sounds are positive. No hepatomegaly.  No abdominal masses felt. No tenderness.  No edema to lower extremities.           Assessment & Plan:  Anemia. Am going to get iron studies. 3 stool cards sent home with patient. Further recommendations to follow.

## 2017-06-13 LAB — CBC WITH DIFFERENTIAL/PLATELET
BASOS ABS: 79 {cells}/uL (ref 0–200)
BASOS PCT: 1.1 %
EOS ABS: 403 {cells}/uL (ref 15–500)
EOS PCT: 5.6 %
HEMATOCRIT: 35.4 % — AB (ref 38.5–50.0)
HEMOGLOBIN: 11.2 g/dL — AB (ref 13.2–17.1)
LYMPHS ABS: 1670 {cells}/uL (ref 850–3900)
MCH: 25.2 pg — AB (ref 27.0–33.0)
MCHC: 31.6 g/dL — AB (ref 32.0–36.0)
MCV: 79.7 fL — ABNORMAL LOW (ref 80.0–100.0)
MPV: 11.1 fL (ref 7.5–12.5)
Monocytes Relative: 8.2 %
NEUTROS ABS: 4457 {cells}/uL (ref 1500–7800)
Neutrophils Relative %: 61.9 %
Platelets: 281 10*3/uL (ref 140–400)
RBC: 4.44 10*6/uL (ref 4.20–5.80)
RDW: 14.3 % (ref 11.0–15.0)
Total Lymphocyte: 23.2 %
WBC mixed population: 590 cells/uL (ref 200–950)
WBC: 7.2 10*3/uL (ref 3.8–10.8)

## 2017-06-13 LAB — FERRITIN: FERRITIN: 98 ng/mL (ref 20–345)

## 2017-06-13 LAB — IRON, TOTAL/TOTAL IRON BINDING CAP
%SAT: 18 % (calc) (ref 15–60)
IRON: 42 ug/dL — AB (ref 50–180)
TIBC: 238 ug/dL — AB (ref 250–425)

## 2017-06-19 ENCOUNTER — Other Ambulatory Visit (INDEPENDENT_AMBULATORY_CARE_PROVIDER_SITE_OTHER): Payer: Self-pay | Admitting: Internal Medicine

## 2017-06-19 ENCOUNTER — Telehealth (INDEPENDENT_AMBULATORY_CARE_PROVIDER_SITE_OTHER): Payer: Self-pay | Admitting: Internal Medicine

## 2017-06-19 DIAGNOSIS — D508 Other iron deficiency anemias: Secondary | ICD-10-CM

## 2017-06-19 NOTE — Telephone Encounter (Signed)
err

## 2017-06-19 NOTE — Telephone Encounter (Signed)
John Terrell, Colonoscopy 

## 2017-06-20 ENCOUNTER — Encounter (INDEPENDENT_AMBULATORY_CARE_PROVIDER_SITE_OTHER): Payer: Self-pay | Admitting: *Deleted

## 2017-06-20 ENCOUNTER — Telehealth (INDEPENDENT_AMBULATORY_CARE_PROVIDER_SITE_OTHER): Payer: Self-pay | Admitting: *Deleted

## 2017-06-20 DIAGNOSIS — D649 Anemia, unspecified: Secondary | ICD-10-CM | POA: Insufficient documentation

## 2017-06-20 MED ORDER — PEG-KCL-NACL-NASULF-NA ASC-C 140 G PO SOLR: 1.0000 | Freq: Once | ORAL | 0 refills | Status: AC

## 2017-06-20 NOTE — Telephone Encounter (Signed)
Patient needs plenvu 

## 2017-06-20 NOTE — Telephone Encounter (Signed)
TCS sch'd 07/12/17, patient aware, instructions mailed

## 2017-06-24 ENCOUNTER — Telehealth (INDEPENDENT_AMBULATORY_CARE_PROVIDER_SITE_OTHER): Payer: Self-pay | Admitting: *Deleted

## 2017-06-24 NOTE — Telephone Encounter (Signed)
   Diagnosis:    Result(s)   Card 1: Negative: 06/21/2017  , Manson PasseyBrown stool    Card 2:Negative:06/22/2017  ,  Manson PasseyBrown Stool   Card 3:Negative: 06/24/2017  , Manson PasseyBrown Stool    Completed by: Larose Hiresammy Samuel Rittenhouse, LPN    HEMOCCULT SENSA DEVELOPER: LOT#:  [id number] EXPIRATION DATE: 2020-10   HEMOCCULT SENSA CARD:  LOT#:  [id number] EXPIRATION DATE: 03/20   CARD CONTROL RESULTS:  POSITIVE: Positive NEGATIVE: Negative    ADDITIONAL COMMENTS: Patient has not been called with results. Forwarded to Dorene Arerri Setzer ,NP - ordering provider.

## 2017-06-26 NOTE — Telephone Encounter (Signed)
Results left on cell phone

## 2017-07-12 ENCOUNTER — Encounter (HOSPITAL_COMMUNITY): Payer: Self-pay | Admitting: *Deleted

## 2017-07-12 ENCOUNTER — Other Ambulatory Visit: Payer: Self-pay

## 2017-07-12 ENCOUNTER — Encounter (HOSPITAL_COMMUNITY)
Admission: RE | Disposition: A | Discharge status: Home / Self Care | Payer: Self-pay | Source: Ambulatory Visit | Attending: Internal Medicine

## 2017-07-12 ENCOUNTER — Ambulatory Visit (HOSPITAL_COMMUNITY)
Admission: RE | Admit: 2017-07-12 | Discharge: 2017-07-12 | Disposition: A | Discharge status: Home / Self Care | Payer: PRIVATE HEALTH INSURANCE | Source: Ambulatory Visit | Attending: Internal Medicine | Admitting: Internal Medicine

## 2017-07-12 DIAGNOSIS — K219 Gastro-esophageal reflux disease without esophagitis: Secondary | ICD-10-CM | POA: Insufficient documentation

## 2017-07-12 DIAGNOSIS — E785 Hyperlipidemia, unspecified: Secondary | ICD-10-CM | POA: Diagnosis not present

## 2017-07-12 DIAGNOSIS — Z880 Allergy status to penicillin: Secondary | ICD-10-CM | POA: Insufficient documentation

## 2017-07-12 DIAGNOSIS — E039 Hypothyroidism, unspecified: Secondary | ICD-10-CM | POA: Insufficient documentation

## 2017-07-12 DIAGNOSIS — D649 Anemia, unspecified: Secondary | ICD-10-CM | POA: Insufficient documentation

## 2017-07-12 DIAGNOSIS — D509 Iron deficiency anemia, unspecified: Secondary | ICD-10-CM | POA: Insufficient documentation

## 2017-07-12 DIAGNOSIS — D508 Other iron deficiency anemias: Secondary | ICD-10-CM

## 2017-07-12 DIAGNOSIS — K648 Other hemorrhoids: Secondary | ICD-10-CM | POA: Insufficient documentation

## 2017-07-12 DIAGNOSIS — Z79899 Other long term (current) drug therapy: Secondary | ICD-10-CM | POA: Insufficient documentation

## 2017-07-12 DIAGNOSIS — Z8249 Family history of ischemic heart disease and other diseases of the circulatory system: Secondary | ICD-10-CM | POA: Diagnosis not present

## 2017-07-12 HISTORY — PX: COLONOSCOPY: SHX5424

## 2017-07-12 HISTORY — DX: Anemia, unspecified: D64.9

## 2017-07-12 SURGERY — COLONOSCOPY
Anesthesia: Moderate Sedation

## 2017-07-12 MED ORDER — MEPERIDINE HCL 50 MG/ML IJ SOLN
INTRAMUSCULAR | Status: AC
  Filled 2017-07-12: qty 1

## 2017-07-12 MED ORDER — MIDAZOLAM HCL 5 MG/5ML IJ SOLN
INTRAMUSCULAR | Status: DC | PRN
  Administered 2017-07-12 (×2): 3 mg via INTRAVENOUS
  Administered 2017-07-12: 2 mg via INTRAVENOUS

## 2017-07-12 MED ORDER — MIDAZOLAM HCL 5 MG/5ML IJ SOLN
INTRAMUSCULAR | Status: AC
  Filled 2017-07-12: qty 10

## 2017-07-12 MED ORDER — FLINTSTONES PLUS IRON PO CHEW: 1.0000 | CHEWABLE_TABLET | Freq: Two times a day (BID) | ORAL | Status: DC

## 2017-07-12 MED ORDER — STERILE WATER FOR IRRIGATION IR SOLN
Status: DC | PRN
  Administered 2017-07-12: 14:00:00

## 2017-07-12 MED ORDER — MEPERIDINE HCL 50 MG/ML IJ SOLN
INTRAMUSCULAR | Status: DC | PRN
  Administered 2017-07-12 (×2): 25 mg via INTRAVENOUS

## 2017-07-12 MED ORDER — SODIUM CHLORIDE 0.9 % IV SOLN
INTRAVENOUS | Status: DC
  Administered 2017-07-12: 13:00:00 via INTRAVENOUS

## 2017-07-12 NOTE — Discharge Instructions (Signed)
Resume usual medications and diet as before. [place]stone chewable with iron 1 tablet by mouth twice daily. No driving for 24 hours. CBC in 1 month.  Office will call.      Colonoscopy, Adult, Care After This sheet gives you information about how to care for yourself after your procedure. Your doctor may also give you more specific instructions. If you have problems or questions, call your doctor. Follow these instructions at home: General instructions   For the first 24 hours after the procedure: ? Do not drive or use machinery. ? Do not sign important documents. ? Do not drink alcohol. ? Do your daily activities more slowly than normal. ? Eat foods that are soft and easy to digest. ? Rest often.  Take over-the-counter or prescription medicines only as told by your doctor.  It is up to you to get the results of your procedure. Ask your doctor, or the department performing the procedure, when your results will be ready. To help cramping and bloating:  Try walking around.  Put heat on your belly (abdomen) as told by your doctor. Use a heat source that your doctor recommends, such as a moist heat pack or a heating pad. ? Put a towel between your skin and the heat source. ? Leave the heat on for 20-30 minutes. ? Remove the heat if your skin turns bright red. This is especially important if you cannot feel pain, heat, or cold. You can get burned. Eating and drinking  Drink enough fluid to keep your pee (urine) clear or pale yellow.  Return to your normal diet as told by your doctor. Avoid heavy or fried foods that are hard to digest.  Avoid drinking alcohol for as long as told by your doctor. Contact a doctor if:  You have blood in your poop (stool) 2-3 days after the procedure. Get help right away if:  You have more than a small amount of blood in your poop.  You see large clumps of tissue (blood clots) in your poop.  Your belly is swollen.  You feel sick to your  stomach (nauseous).  You throw up (vomit).  You have a fever.  You have belly pain that gets worse, and medicine does not help your pain. This information is not intended to replace advice given to you by your health care provider. Make sure you discuss any questions you have with your health care provider. Document Released: 07/14/2010 Document Revised: 03/05/2016 Document Reviewed: 03/05/2016 Elsevier Interactive Patient Education  2017 [employer]    Hemorrhoids Hemorrhoids are swollen veins in and around the rectum or anus. There are two types of hemorrhoids:  Internal hemorrhoids. These occur in the veins that are just inside the rectum. They may poke through to the outside and become irritated and painful.  External hemorrhoids. These occur in the veins that are outside of the anus and can be felt as a painful swelling or hard lump near the anus.  Most hemorrhoids do not cause serious problems, and they can be managed with home treatments such as diet and lifestyle changes. If home treatments do not help your symptoms, procedures can be done to shrink or remove the hemorrhoids. What are the causes? This condition is caused by increased pressure in the anal area. This pressure may result from various things, including:  Constipation.  Straining to have a bowel movement.  Diarrhea.  Pregnancy.  Obesity.  Sitting for long periods of time.  Heavy lifting or other activity that  causes you to strain.  Anal sex.  What are the signs or symptoms? Symptoms of this condition include:  Pain.  Anal itching or irritation.  Rectal bleeding.  Leakage of stool (feces).  Anal swelling.  One or more lumps around the anus.  How is this diagnosed? This condition can often be diagnosed through a visual exam. Other exams or tests may also be done, such as:  Examination of the rectal area with a gloved hand (digital rectal exam).  Examination of the anal canal using a  small tube (anoscope).  A blood test, if you have lost a significant amount of blood.  A test to look inside the colon (sigmoidoscopy or colonoscopy).  How is this treated? This condition can usually be treated at home. However, various procedures may be done if dietary changes, lifestyle changes, and other home treatments do not help your symptoms. These procedures can help make the hemorrhoids smaller or remove them completely. Some of these procedures involve surgery, and others do not. Common procedures include:  Rubber band ligation. Rubber bands are placed at the base of the hemorrhoids to cut off the blood supply to them.  Sclerotherapy. Medicine is injected into the hemorrhoids to shrink them.  Infrared coagulation. A type of light energy is used to get rid of the hemorrhoids.  Hemorrhoidectomy surgery. The hemorrhoids are surgically removed, and the veins that supply them are tied off.  Stapled hemorrhoidopexy surgery. A circular stapling device is used to remove the hemorrhoids and use staples to cut off the blood supply to them.  Follow these instructions at home: Eating and drinking  Eat foods that have a lot of fiber in them, such as whole grains, beans, nuts, fruits, and vegetables. Ask your health care provider about taking products that have added fiber (fiber supplements).  Drink enough fluid to keep your urine clear or pale yellow. Managing pain and swelling  Take warm sitz baths for 20 minutes, 3-4 times a day to ease pain and discomfort.  If directed, apply ice to the affected area. Using ice packs between sitz baths may be helpful. ? Put ice in a plastic bag. ? Place a towel between your skin and the bag. ? Leave the ice on for 20 minutes, 2-3 times a day. General instructions  Take over-the-counter and prescription medicines only as told by your health care provider.  Use medicated creams or suppositories as told.  Exercise regularly.  Go to the bathroom  when you have the urge to have a bowel movement. Do not wait.  Avoid straining to have bowel movements.  Keep the anal area dry and clean. Use wet toilet paper or moist towelettes after a bowel movement.  Do not sit on the toilet for long periods of time. This increases blood pooling and pain. Contact a health care provider if:  You have increasing pain and swelling that are not controlled by treatment or medicine.  You have uncontrolled bleeding.  You have difficulty having a bowel movement, or you are unable to have a bowel movement.  You have pain or inflammation outside the area of the hemorrhoids. This information is not intended to replace advice given to you by your health care provider. Make sure you discuss any questions you have with your health care provider. Document Released: 06/08/2000 Document Revised: 11/09/2015 Document Reviewed: 02/23/2015 Elsevier Interactive Patient Education  [employer].

## 2017-07-12 NOTE — Op Note (Signed)
[facility] Patient Name: John Terrell Procedure Date: 07/12/2017 1:23 PM MRN: [id number] Date of Birth: Nov 02, 1975 Attending MD: Lionel December , MD CSN: [id number] Age: 42 Admit Type: Outpatient Procedure:                Colonoscopy Indications:              Iron deficiency anemia Providers:                Lionel December, MD, Loma Messing B. Patsy Lager, RN, Carlean Purl RN, RN Referring MD:             Vilinda Blanks. Gerda Diss, MD Medicines:                Meperidine 50 mg IV, Midazolam 8 mg IV Complications:            No immediate complications. Estimated Blood Loss:     Estimated blood loss: none. Procedure:                Pre-Anesthesia Assessment:                           - Prior to the procedure, a History and Physical                            was performed, and patient medications and                            allergies were reviewed. The patient's tolerance of                            previous anesthesia was also reviewed. The risks                            and benefits of the procedure and the sedation                            options and risks were discussed with the patient.                            All questions were answered, and informed consent                            was obtained. Prior Anticoagulants: The patient has                            taken no previous anticoagulant or antiplatelet                            agents. ASA Grade Assessment: II - A patient with                            mild systemic disease. After reviewing the risks  and benefits, the patient was deemed in                            satisfactory condition to undergo the procedure.                           After obtaining informed consent, the colonoscope                            was passed under direct vision. Throughout the                            procedure, the patient's blood pressure, pulse, and                             oxygen saturations were monitored continuously. The                            EC-[id number] ([id number]) scope was introduced through                            the anus and advanced to the the terminal ileum,                            with identification of the appendiceal orifice and                            IC valve. The colonoscopy was performed without                            difficulty. The patient tolerated the procedure                            well. The quality of the bowel preparation was                            excellent. The terminal ileum, ileocecal valve,                            appendiceal orifice, and rectum were photographed. Scope In: 2:02:12 PM Scope Out: 2:14:27 PM Total Procedure Duration: 0 hours 12 minutes 15 seconds  Findings:      The perianal and digital rectal examinations were normal.      The terminal ileum appeared normal.      The colon (entire examined portion) appeared normal.      Internal hemorrhoids were found during retroflexion. The hemorrhoids       were small. Impression:               - The examined portion of the ileum was normal.                           - The entire examined colon is normal.                           -  Internal hemorrhoids.                           - No specimens collected.                           :Comment: IDA may be due to dietary changes. Moderate Sedation:      Moderate (conscious) sedation was administered by the endoscopy nurse       and supervised by the endoscopist. The following parameters were       monitored: oxygen saturation, heart rate, blood pressure, CO2       capnography and response to care. Total physician intraservice time was       20 minutes. Recommendation:           - Patient has a contact number available for                            emergencies. The signs and symptoms of potential                            delayed complications were discussed with the                             patient. Return to normal activities tomorrow.                            Written discharge instructions were provided to the                            patient.                           - Resume previous diet today.                           - Continue present medications.                           - Flintstones chewable with iron bid.                           - CBC in one month.                           - Repeat colonoscopy at age 42 for screening                            purposes. Procedure Code(s):        --- Professional ---                           [phone], Colonoscopy, flexible; diagnostic, including                            collection of specimen(s) by brushing or washing,  when performed (separate procedure)                           99152, Moderate sedation services provided by the                            same physician or other qualified health care                            professional performing the diagnostic or                            therapeutic service that the sedation supports,                            requiring the presence of an independent trained                            observer to assist in the monitoring of the                            patient's level of consciousness and physiological                            status; initial 15 minutes of intraservice time,                            patient age 40 years or older Diagnosis Code(s):        --- Professional ---                           K64.8, Other hemorrhoids                           D50.9, Iron deficiency anemia, unspecified CPT copyright 2016 American Medical Association. All rights reserved. The codes documented in this report are preliminary and upon coder review may  be revised to meet current compliance requirements. Lionel December, MD Lionel December, MD 07/12/2017 2:25:22 PM This report has been signed electronically. Number of Addenda: 0

## 2017-07-12 NOTE — H&P (Signed)
John Terrell is an 42 y.o. male.   Chief Complaint: Patient is here for colonoscopy. HPI: [name] who was recently found to have anemia with hemoglobin 11 g.  He was noted to have drop in his MCV and serum iron is low at 42.  Serum ferritin is normal at 98. He denies melena or rectal bleed he also denies abdominal pain or diarrhea.  He has symptoms of GERD and has been on PPI for about 4 months.  Heartburn well controlled with therapy.  He has cut back on meat intake significantly. Family history is negative for IBD celiac studies are CRC.  Past Medical History:  Diagnosis Date  . Anemia   . GERD (gastroesophageal reflux disease)   . Hyperlipidemia   . Hypothyroidism     Past Surgical History:  Procedure Laterality Date  . ESOPHAGEAL DILATION N/A 03/11/2017   Procedure: ESOPHAGEAL DILATION;  Surgeon: Malissa Hippoehman, Matthew Pais U, MD;  Location: [facility];  Service: Endoscopy;  Laterality: N/A;  . ESOPHAGOGASTRODUODENOSCOPY N/A 03/11/2017   Procedure: ESOPHAGOGASTRODUODENOSCOPY (EGD);  Surgeon: Malissa Hippoehman, Ras Kollman U, MD;  Location: [facility];  Service: Endoscopy;  Laterality: N/A;  . HERNIA REPAIR  2008 - 2009    Family History  Problem Relation Age of Onset  . Heart disease Father   . Heart disease Maternal Grandfather   . Heart disease Paternal Grandfather    Social History:  reports that  has never smoked. he has never used smokeless tobacco. He reports that he drinks alcohol. He reports that he does not use drugs.  Allergies:  Allergies  Allergen Reactions  . Amoxil [Amoxicillin]     Rash, can take cephalosporin    Medications Prior to Admission  Medication Sig Dispense Refill  . levothyroxine (SYNTHROID, LEVOTHROID) 100 MCG tablet Take 1 tablet (100 mcg total) by mouth daily. 90 tablet 1  . pantoprazole (PROTONIX) 40 MG tablet Take 1 tablet (40 mg total) by mouth daily. 30 tablet 5  . simvastatin (ZOCOR) 20 MG tablet Take 1 tablet (20 mg total) by mouth  daily. 90 tablet 1    No results found for this or any previous visit (from the past 48 hour(s)). No results found.  ROS  Blood pressure (!) 157/81, pulse 66, temperature 98.1 F (36.7 C), temperature source Oral, resp. rate 14, height 6\' 3"  (1.905 m), weight (!) 365 lb (165.6 kg), SpO2 100 %. Physical Exam  Constitutional: He appears well-developed and well-nourished.  HENT:  Mouth/Throat: Oropharynx is clear and moist.  Eyes: Conjunctivae are normal. No scleral icterus.  Neck: No thyromegaly present.  Cardiovascular: Normal rate, regular rhythm and normal heart sounds.  No murmur heard. Respiratory: Effort normal and breath sounds normal.  GI:  Abdomen is full but soft and nontender without organomegaly or masses.  Lymphadenopathy:    He has no cervical adenopathy.     Assessment/Plan Recent develop microcytic anemia with low serum iron system with iron deficiency anemia. Diagnostic colonoscopy.  John DecemberNajeeb Journie Howson, MD 07/12/2017, 1:47 PM

## 2017-07-15 ENCOUNTER — Encounter (HOSPITAL_COMMUNITY): Payer: Self-pay | Admitting: Internal Medicine

## 2017-07-29 ENCOUNTER — Other Ambulatory Visit: Payer: Self-pay | Admitting: Family Medicine

## 2017-08-14 ENCOUNTER — Other Ambulatory Visit: Payer: Self-pay | Admitting: Family Medicine

## 2017-10-30 ENCOUNTER — Other Ambulatory Visit: Payer: Self-pay | Admitting: Family Medicine

## 2017-11-01 ENCOUNTER — Telehealth: Payer: Self-pay | Admitting: Family Medicine

## 2017-11-01 DIAGNOSIS — Z0000 Encounter for general adult medical examination without abnormal findings: Secondary | ICD-10-CM

## 2017-11-01 DIAGNOSIS — E039 Hypothyroidism, unspecified: Secondary | ICD-10-CM

## 2017-11-01 DIAGNOSIS — E785 Hyperlipidemia, unspecified: Secondary | ICD-10-CM

## 2017-11-01 NOTE — Telephone Encounter (Signed)
Patient wants to know when he is due to come back in the office to see Dr. Brett Canales?  Also, if he is due for labs?

## 2017-11-04 NOTE — Telephone Encounter (Signed)
Discussed with pt. Pt verbalized understanding. Orders for bw put in and pt notified to do one week before his appointment. Pt transferred to the front to schedule office visit

## 2017-11-04 NOTE — Telephone Encounter (Signed)
Due for wellness in July, due for f u on anemia and thyroid issues soon, can combine both in one visit,  Wellness plus chronic in June or July  Lip , liv, cbc, met 7, tsh

## 2017-11-22 LAB — CBC WITH DIFFERENTIAL/PLATELET
BASOS: 1 %
Basophils Absolute: 0 10*3/uL (ref 0.0–0.2)
EOS (ABSOLUTE): 0.3 10*3/uL (ref 0.0–0.4)
Eos: 5 %
HEMATOCRIT: 39.6 % (ref 37.5–51.0)
HEMOGLOBIN: 13.3 g/dL (ref 13.0–17.7)
Immature Grans (Abs): 0 10*3/uL (ref 0.0–0.1)
Immature Granulocytes: 0 %
LYMPHS ABS: 1.6 10*3/uL (ref 0.7–3.1)
Lymphs: 28 %
MCH: 26.8 pg (ref 26.6–33.0)
MCHC: 33.6 g/dL (ref 31.5–35.7)
MCV: 80 fL (ref 79–97)
MONOCYTES: 7 %
Monocytes Absolute: 0.4 10*3/uL (ref 0.1–0.9)
NEUTROS ABS: 3.4 10*3/uL (ref 1.4–7.0)
Neutrophils: 59 %
Platelets: 249 10*3/uL (ref 150–450)
RBC: 4.96 x10E6/uL (ref 4.14–5.80)
RDW: 15.4 % (ref 12.3–15.4)
WBC: 5.8 10*3/uL (ref 3.4–10.8)

## 2017-11-22 LAB — BASIC METABOLIC PANEL
BUN / CREAT RATIO: 13 (ref 9–20)
BUN: 10 mg/dL (ref 6–20)
CO2: 24 mmol/L (ref 20–29)
CREATININE: 0.8 mg/dL (ref 0.76–1.27)
Calcium: 8.9 mg/dL (ref 8.7–10.2)
Chloride: 106 mmol/L (ref 96–106)
GFR calc Af Amer: 130 mL/min/{1.73_m2} (ref >59)
GFR, EST NON AFRICAN AMERICAN: 113 mL/min/{1.73_m2} (ref >59)
Glucose: 99 mg/dL (ref 65–99)
Potassium: 4.4 mmol/L (ref 3.5–5.2)
SODIUM: 144 mmol/L (ref 134–144)

## 2017-11-22 LAB — LIPID PANEL
CHOL/HDL RATIO: 6 ratio — AB (ref 0.0–5.0)
Cholesterol, Total: 227 mg/dL — ABNORMAL HIGH (ref 100–199)
HDL: 38 mg/dL — ABNORMAL LOW (ref >39)
LDL Calculated: 162 mg/dL — ABNORMAL HIGH (ref 0–99)
TRIGLYCERIDES: 134 mg/dL (ref 0–149)
VLDL Cholesterol Cal: 27 mg/dL (ref 5–40)

## 2017-11-22 LAB — HEPATIC FUNCTION PANEL
ALT: 18 IU/L (ref 0–44)
AST: 24 IU/L (ref 0–40)
Albumin: 3.5 g/dL (ref 3.5–5.5)
Alkaline Phosphatase: 72 IU/L (ref 39–117)
BILIRUBIN TOTAL: 0.5 mg/dL (ref 0.0–1.2)
BILIRUBIN, DIRECT: 0.11 mg/dL (ref 0.00–0.40)
TOTAL PROTEIN: 5.5 g/dL — AB (ref 6.0–8.5)

## 2017-11-22 LAB — TSH: TSH: 4.35 u[IU]/mL (ref 0.450–4.500)

## 2017-11-26 ENCOUNTER — Encounter: Payer: Self-pay | Admitting: Family Medicine

## 2017-11-26 ENCOUNTER — Ambulatory Visit: Payer: PRIVATE HEALTH INSURANCE | Admitting: Family Medicine

## 2017-11-26 VITALS — BP 136/78 | Ht 73.0 in | Wt 392.0 lb

## 2017-11-26 DIAGNOSIS — E785 Hyperlipidemia, unspecified: Secondary | ICD-10-CM | POA: Diagnosis not present

## 2017-11-26 DIAGNOSIS — R5383 Other fatigue: Secondary | ICD-10-CM | POA: Diagnosis not present

## 2017-11-26 DIAGNOSIS — E039 Hypothyroidism, unspecified: Secondary | ICD-10-CM | POA: Diagnosis not present

## 2017-11-26 DIAGNOSIS — E038 Other specified hypothyroidism: Secondary | ICD-10-CM | POA: Diagnosis not present

## 2017-11-26 DIAGNOSIS — I878 Other specified disorders of veins: Secondary | ICD-10-CM | POA: Diagnosis not present

## 2017-11-26 DIAGNOSIS — Z79899 Other long term (current) drug therapy: Secondary | ICD-10-CM

## 2017-11-26 DIAGNOSIS — K2100 Gastro-esophageal reflux disease with esophagitis, without bleeding: Secondary | ICD-10-CM

## 2017-11-26 DIAGNOSIS — R7301 Impaired fasting glucose: Secondary | ICD-10-CM

## 2017-11-26 DIAGNOSIS — Z0000 Encounter for general adult medical examination without abnormal findings: Secondary | ICD-10-CM

## 2017-11-26 MED ORDER — LEVOTHYROXINE SODIUM 125 MCG PO TABS: 125.0000 ug | ORAL_TABLET | Freq: Every day | ORAL | 1 refills | Status: DC

## 2017-11-26 MED ORDER — SIMVASTATIN 40 MG PO TABS: 40.0000 mg | ORAL_TABLET | Freq: Every day | ORAL | 3 refills | Status: DC

## 2017-11-26 NOTE — Progress Notes (Signed)
Subjective:    Patient ID: John Terrell, male    DOB: 10/18/1975, 42 y.o.   MRN: [id number] Patient arrives with numerous concerns along with wellness visit HPI The patient comes in today for a wellness visit.    A review of their health history was completed.  A review of medications was also completed.  Any needed refills;Yes  Eating habits: Trying to eat healthy  Falls/  MVA accidents in past few months: no  Regular exercise: yes at least 3 times per day   Specialist pt sees on regular basis: GI Dr.Rahman  Preventative health issues were discussed.   Additional concerns: on hot days his feet swell.   Patient is also here to follow up on his chronic illnesses.Patient states he has stopped the reflux medication when he stopped sodas and citrus drinks and beef.  Results for orders placed or performed in visit on 11/01/17  Lipid panel  Result Value Ref Range   Cholesterol, Total 227 (H) 100 - 199 mg/dL   Triglycerides 914134 0 - 149 mg/dL   HDL 38 (L) >78>39 mg/dL   VLDL Cholesterol Cal 27 5 - 40 mg/dL   LDL Calculated 295162 (H) 0 - 99 mg/dL   Chol/HDL Ratio 6.0 (H) 0.0 - 5.0 ratio  Hepatic function panel  Result Value Ref Range   Total Protein 5.5 (L) 6.0 - 8.5 g/dL   Albumin 3.5 3.5 - 5.5 g/dL   Bilirubin Total 0.5 0.0 - 1.2 mg/dL   Bilirubin, Direct 6.210.11 0.00 - 0.40 mg/dL   Alkaline Phosphatase 72 39 - 117 IU/L   AST 24 0 - 40 IU/L   ALT 18 0 - 44 IU/L  CBC with Differential/Platelet  Result Value Ref Range   WBC 5.8 3.4 - 10.8 x10E3/uL   RBC 4.96 4.14 - 5.80 x10E6/uL   Hemoglobin 13.3 13.0 - 17.7 g/dL   Hematocrit 30.839.6 65.737.5 - 51.0 %   MCV 80 79 - 97 fL   MCH 26.8 26.6 - 33.0 pg   MCHC 33.6 31.5 - 35.7 g/dL   RDW 84.615.4 96.212.3 - 95.215.4 %   Platelets 249 150 - 450 x10E3/uL   Neutrophils 59 Not Estab. %   Lymphs 28 Not Estab. %   Monocytes 7 Not Estab. %   Eos 5 Not Estab. %   Basos 1 Not Estab. %   Neutrophils Absolute 3.4 1.4 - 7.0 x10E3/uL   Lymphocytes  Absolute 1.6 0.7 - 3.1 x10E3/uL   Monocytes Absolute 0.4 0.1 - 0.9 x10E3/uL   EOS (ABSOLUTE) 0.3 0.0 - 0.4 x10E3/uL   Basophils Absolute 0.0 0.0 - 0.2 x10E3/uL   Immature Granulocytes 0 Not Estab. %   Immature Grans (Abs) 0.0 0.0 - 0.1 x10E3/uL  Basic metabolic panel  Result Value Ref Range   Glucose 99 65 - 99 mg/dL   BUN 10 6 - 20 mg/dL   Creatinine, Ser 8.410.80 0.76 - 1.27 mg/dL   GFR calc non Af Amer 113 >59 mL/min/1.73   GFR calc Af Amer 130 >59 mL/min/1.73   BUN/Creatinine Ratio 13 9 - 20   Sodium 144 134 - 144 mmol/L   Potassium 4.4 3.5 - 5.2 mmol/L   Chloride 106 96 - 106 mmol/L   CO2 24 20 - 29 mmol/L   Calcium 8.9 8.7 - 10.2 mg/dL  TSH  Result Value Ref Range   TSH 4.350 0.450 - 4.500 uIU/mL   Exercising reg, doing 45 min of cardio three to four times oer wk  Patient continues to take lipid medication regularly. No obvious side effects from it. Generally does not miss a dose. Prior blood work results are reviewed with patient. Patient continues to work on fat intake in diet  Patient compliant with thyroid medication.  Does not miss a dose.  No excessive fatigue or tiredness.  Prior blood work reviewed with patient.  Patient aware that he has serious morbid obesity.  He has worked hard on his own.  Would like dietary consultation and try to help things out.  In addition he is amenable to potential bariatric intervention if his efforts to not work.  Notes substantial improvement in reflux symptoms since adjusting his diet.  Is come off this medication.  Notes swelling in the ankles.  Worse on hot days.  Has developed year, wondering about this  Review of Systems  Constitutional: Negative for activity change, appetite change and fever.  HENT: Negative for congestion and rhinorrhea.   Eyes: Negative for discharge.  Respiratory: Negative for cough and wheezing.   Cardiovascular: Negative for chest pain.  Gastrointestinal: Negative for abdominal pain, blood in stool and  vomiting.  Genitourinary: Negative for difficulty urinating and frequency.  Musculoskeletal: Negative for neck pain.  Skin: Negative for rash.  Allergic/Immunologic: Negative for environmental allergies and food allergies.  Neurological: Negative for weakness and headaches.  Psychiatric/Behavioral: Negative for agitation.  All other systems reviewed and are negative.      Objective:   Physical Exam  Constitutional: He appears well-developed and well-nourished.  Morbid obesity present  HENT:  Head: Normocephalic and atraumatic.  Right Ear: External ear normal.  Left Ear: External ear normal.  Nose: Nose normal.  Mouth/Throat: Oropharynx is clear and moist.  Eyes: Pupils are equal, round, and reactive to light. EOM are normal.  Neck: Normal range of motion. Neck supple. No thyromegaly present.  Cardiovascular: Normal rate, regular rhythm and normal heart sounds.  No murmur heard. Pulmonary/Chest: Effort normal and breath sounds normal. No respiratory distress. He has no wheezes.  Abdominal: Soft. Bowel sounds are normal. He exhibits no distension and no mass. There is no tenderness.  Genitourinary: Penis normal.  Musculoskeletal: Normal range of motion. He exhibits no edema.  Ankles 1+ edema bilateral.  Good pulses in the feet.  Sensation intact.  Some increased venules  Lymphadenopathy:    He has no cervical adenopathy.  Neurological: He is alert. He exhibits normal muscle tone.  Skin: Skin is warm and dry. No erythema.  Psychiatric: He has a normal mood and affect. His behavior is normal. Judgment normal.  Vitals reviewed.         Assessment & Plan:  1 1 wellness exam.  Gets yearly flu shot.  Diet discussed.  Exercise discussed.  Up-to-date on colonoscopy.  2.  Hyperlipidemia.  Control okay but not ideal.  Discussed.  Will increase simvastatin rationale discussed.  3.  Hypothyroidism.  Now on solid dose yet TSH borderline high.  We will go ahead and make adjustments to  try to stay the patient's needs.  Rationale discussed.  4.  Venous stasis.  I like the ankle edema.  Pathophysiology discussed with patient  Morbid obesity.  Do official dietary referral.  Follow-up in 4 months if no substantial improvement will work on bariatric referral rationale discussed

## 2017-12-02 ENCOUNTER — Encounter (INDEPENDENT_AMBULATORY_CARE_PROVIDER_SITE_OTHER): Payer: Self-pay

## 2017-12-02 ENCOUNTER — Encounter: Payer: Self-pay | Admitting: Family Medicine

## 2017-12-25 ENCOUNTER — Encounter: Payer: PRIVATE HEALTH INSURANCE | Attending: Family Medicine | Admitting: Registered"

## 2017-12-25 DIAGNOSIS — Z713 Dietary counseling and surveillance: Secondary | ICD-10-CM | POA: Diagnosis not present

## 2017-12-25 NOTE — Progress Notes (Signed)
Medical Nutrition Therapy:  Appt start time: 1400 end time:  1505.   Assessment:  Primary concerns today: Pt states he does not have any pressing health issues and states he wants to prevent heart disease (family history) and be around for his children 6 yrs and 40 yr old. Pt states he plans to take his children to [place] soon and wants to have enough energy to walk and enjoy the park all day.   Patient states he has already started making changes, such as cooking more meals at home and committing to exercise.  Pt states he has lost weight in the past by paying more attention to diet and exercise. Pt states he would like to have more energy, denies issues of being out of breath or joint pain. Pt states he used to have acid reflux but stopped eating beef and citrus and GERD has resolved and he no longer takes medication for it.  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 Pt states [facility], cardio 30-45 min, and goes through weight routine.   Pt states his energy for the last 2-3 yrs has not been as high as he would like it to be. Rates 5/6 out of 10. Pt states he believes it is partly due to thyroid issue and low iron (takes supplement). Pt states his sleep is good, gets 6-8 hours of sleep each night. Pt states the time he goes to bed is variable, but usually gets up ~6:15 when his toddler wakes up.   Pt states he has a stressful job, 7/8 out of 10, but finds ways to manage that he states it does not adversely affect his life.   Preferred Learning Style:  Hands on   Learning Readiness:   Change in progress  MEDICATIONS: reviewed   DIETARY INTAKE:  24-hr recall:  B ( AM): microwave biscuit OR eggs, mini bagels, bacon (weekends) Snk ( AM): none  L ( PM): skips OR left overs OR [place] or sandwich Snk ( PM): mini muffins OR left overs D ( PM): (heavy) [place] subway, fries or chips OR chicken, beans, OR pizza OR breakfast  casserole OR salad Snk ( PM): none OR tortilla chips with cheese Beverages: 8-32 oz of water per setting, diet soda,1/2-1/2 sweet tea  Usual physical activity: 3x week, cardio 30-45 min + weight resistance  Estimated energy needs: 2200 calories  Progress Towards Goal(s):  New goals.   Nutritional Diagnosis:  NI-5.8.5 Inadeqate fiber intake As related to limted whole grains, fruits and veggies.  As evidenced by dietary recall.    Intervention:  Nutrition Education. Discussed balanced eating. Discussed role of snacks. Discussed mindful eating. Discussed movement & exercise. Discussed realistic expectations from weight loss programs.  Plan: Aim to eat 3 balanced meals per day, snacks if hungry Breakfast idea: egg and cheese on whole wheat English muffin (microwaved). Smoothie sometimes to help get in more vegetables & fruits Great idea to have microwave lunches at work for when you cannot go home. Aim to get more vegetables and fruits in your diet. Consider having fish 2-3x week, tuna, salmon, trout, mackerel are high in omega 3. Continue structured exercise, including weight resistance. Consider avoiding sitting for more than 2 hours (less than 30 is the recommendation)  Aim to get 8 hours of sleep each night.   Teaching Method Utilized:  Visual Auditory  Handouts given during visit include:  MyPlate Planner  Sleep Hygiene  Barriers to learning/adherence to lifestyle change: none  Demonstrated degree of understanding via:  Teach Back   Monitoring/Evaluation:  Dietary intake, exercise, and body weight prn.

## 2017-12-25 NOTE — Patient Instructions (Addendum)
Aim to eat 3 balanced meals per day, snacks if hungry Breakfast idea: egg and cheese on whole wheat English muffin (microwaved). Smoothie sometimes to help get in more vegetables & fruits Great idea to have microwave lunches at work for when you cannot go home. Aim to get more vegetables and fruits in your diet. Consider having fish 2-3x week, tuna, salmon, trout, mackerel are high in omega 3. Continue structured exercise, including weight resistance. Consider avoiding sitting for more than 2 hours (less than 30 is the recommendation)  Aim to get 8 hours of sleep each night.

## 2018-02-06 ENCOUNTER — Ambulatory Visit (INDEPENDENT_AMBULATORY_CARE_PROVIDER_SITE_OTHER): Payer: PRIVATE HEALTH INSURANCE | Admitting: Otolaryngology

## 2018-02-06 DIAGNOSIS — H6123 Impacted cerumen, bilateral: Secondary | ICD-10-CM

## 2018-03-28 ENCOUNTER — Ambulatory Visit: Payer: PRIVATE HEALTH INSURANCE | Admitting: Family Medicine

## 2018-03-28 LAB — TSH: TSH: 1.39 u[IU]/mL (ref 0.450–4.500)

## 2018-03-28 LAB — LIPID PANEL
CHOL/HDL RATIO: 5.7 ratio — AB (ref 0.0–5.0)
Cholesterol, Total: 199 mg/dL (ref 100–199)
HDL: 35 mg/dL — ABNORMAL LOW (ref >39)
LDL CALC: 147 mg/dL — AB (ref 0–99)
Triglycerides: 85 mg/dL (ref 0–149)
VLDL CHOLESTEROL CAL: 17 mg/dL (ref 5–40)

## 2018-03-28 LAB — HEPATIC FUNCTION PANEL
ALBUMIN: 3.9 g/dL (ref 3.5–5.5)
ALK PHOS: 70 IU/L (ref 39–117)
ALT: 16 IU/L (ref 0–44)
AST: 14 IU/L (ref 0–40)
BILIRUBIN TOTAL: 0.4 mg/dL (ref 0.0–1.2)
BILIRUBIN, DIRECT: 0.11 mg/dL (ref 0.00–0.40)
Total Protein: 5.7 g/dL — ABNORMAL LOW (ref 6.0–8.5)

## 2018-03-31 ENCOUNTER — Ambulatory Visit (INDEPENDENT_AMBULATORY_CARE_PROVIDER_SITE_OTHER): Payer: PRIVATE HEALTH INSURANCE | Admitting: Family Medicine

## 2018-03-31 ENCOUNTER — Encounter: Payer: Self-pay | Admitting: Family Medicine

## 2018-03-31 DIAGNOSIS — E785 Hyperlipidemia, unspecified: Secondary | ICD-10-CM | POA: Diagnosis not present

## 2018-03-31 DIAGNOSIS — E038 Other specified hypothyroidism: Secondary | ICD-10-CM

## 2018-03-31 MED ORDER — SIMVASTATIN 40 MG PO TABS: 40.0000 mg | ORAL_TABLET | Freq: Every day | ORAL | 1 refills | Status: DC

## 2018-03-31 MED ORDER — LEVOTHYROXINE SODIUM 125 MCG PO TABS: 125.0000 ug | ORAL_TABLET | Freq: Every day | ORAL | 1 refills | Status: DC

## 2018-03-31 NOTE — Progress Notes (Signed)
   Subjective:    Patient ID: John Terrell, male    DOB: 14-Nov-1975, 42 y.o.   MRN: [id number]  Hyperlipidemia  This is a chronic problem. The current episode started more than 1 year ago. Treatments tried: zocor. There are no compliance problems.  Risk factors for coronary artery disease include dyslipidemia and obesity.   Patient would also like to discuss possible referral for weight loss surgery.  Results for orders placed or performed in visit on 11/26/17  Hepatic function panel  Result Value Ref Range   Total Protein 5.7 (L) 6.0 - 8.5 g/dL   Albumin 3.9 3.5 - 5.5 g/dL   Bilirubin Total 0.4 0.0 - 1.2 mg/dL   Bilirubin, Direct 4.09 0.00 - 0.40 mg/dL   Alkaline Phosphatase 70 39 - 117 IU/L   AST 14 0 - 40 IU/L   ALT 16 0 - 44 IU/L  Lipid panel  Result Value Ref Range   Cholesterol, Total 199 100 - 199 mg/dL   Triglycerides 85 0 - 149 mg/dL   HDL 35 (L) >81 mg/dL   VLDL Cholesterol Cal 17 5 - 40 mg/dL   LDL Calculated 191 (H) 0 - 99 mg/dL   Chol/HDL Ratio 5.7 (H) 0.0 - 5.0 ratio  TSH  Result Value Ref Range   TSH 1.390 0.450 - 4.500 uIU/mL   Walking sveral days per week   Watching diet better, frustrated by not major weight loss   Thyroid compliant with medications.  Does not miss a dose.  Blood work reviewed with patient.  Patient continues to take lipid medication regularly. No obvious side effects from it. Generally does not miss a dose. Prior blood work results are reviewed with patient. Patient continues to work on fat intake in diet    Review of Systems No headache, no major weight loss or weight gain, no chest pain no back pain abdominal pain no change in bowel habits complete ROS otherwise negative     Objective:   Physical Exam    .de Alert and oriented, vitals reviewed and stable, NAD ENT-TM's and ext canals WNL bilat via otoscopic exam Soft palate, tonsils and post pharynx WNL via oropharyngeal exam Neck-symmetric, no masses; thyroid nonpalpable  and nontender Pulmonary-no tachypnea or accessory muscle use; Clear without wheezes via auscultation Card--no abnrml murmurs, rhythm reg and rate WNL Carotid pulses symmetric, without bruits     Assessment & Plan:  Impression 1 hypothyroidism very good control discussed maintain same meds rationale discussed  2 hyperlipidemia.  LDL enzymes normal  3.  Morbid obesity.  Discussed at great length.  Minimal improvement despite dietary referral and improved efforts.  Bariatric referral.  Patient is we will process diet exercise discussed  In 6 months for wellness plus chronic

## 2018-04-07 ENCOUNTER — Encounter: Payer: Self-pay | Admitting: Family Medicine

## 2018-04-23 ENCOUNTER — Encounter: Payer: Self-pay | Admitting: Family Medicine

## 2018-04-23 ENCOUNTER — Ambulatory Visit: Payer: PRIVATE HEALTH INSURANCE | Admitting: Family Medicine

## 2018-04-23 VITALS — BP 126/88 | Temp 99.7°F | Ht 73.0 in | Wt 376.0 lb

## 2018-04-23 DIAGNOSIS — J329 Chronic sinusitis, unspecified: Secondary | ICD-10-CM | POA: Diagnosis not present

## 2018-04-23 MED ORDER — DOXYCYCLINE HYCLATE 100 MG PO TABS: 100.0000 mg | ORAL_TABLET | Freq: Two times a day (BID) | ORAL | 0 refills | Status: DC

## 2018-04-23 NOTE — Progress Notes (Signed)
   Subjective:    Patient ID: John Terrell, male    DOB: 01/08/1976, 42 y.o.   MRN: [id number]  HPI  Patient is here today with complaints of a sore throat, diarrhea,cough wheezing, runny nose,and sinus headache,fever. This started two days ago while at [employer], they just flew in today.He has been taking Ibuprofen.  trhoat congestd and wheezy  Cough very bad at times      Frontal face and forehead, more in the cheeks    achey elsewhere not constant but  Some going on       Legs aching    sppetite  Not the best   tmax 101  Review of Systems, no major weight loss or weight gain, no chest pain no back pain abdominal pain no change in bowel habits complete ROS otherwise negative     Objective:   Physical Exam   Alert, mild malaise. Hydration good Vitals stable. frontal/ maxillary tenderness evident positive nasal congestion. pharynx normal neck supple  lungs clear/no crackles or wheezes. heart regular in rhythm     Assessment & Plan:  Impression rhinosinusitis likely post viral, discussed with patient. plan antibiotics prescribed. Questions answered. Symptomatic care discussed. warning signs discussed. WSL Discussion held.  Since on an airplane.  Theoretically possible patient's viral syndrome could include an element of the flu.  However nearly 48 hours and symptoms.  Will cover with antibiotics.  Symptom care discussed.  If others in the family developed significant issues will need to consider potential for flu discussed

## 2018-04-24 ENCOUNTER — Telehealth: Payer: Self-pay | Admitting: Family Medicine

## 2018-04-24 NOTE — Telephone Encounter (Signed)
Patient advised that Dr Brett Canales recommends to stay on the same treatment, would not be surprised with persitent fever for couple, three  days after starting medicine. Patient verbalized understanding.

## 2018-04-24 NOTE — Telephone Encounter (Signed)
Stay on the same treatment, would not be surprised with persitent fever for coupe, three  days after starting medicine

## 2018-04-24 NOTE — Telephone Encounter (Signed)
Seen yesterday and was told to call back if any changes. He states he is taking ibuprofen 200mg  2 every 4 hours but at the end of the 4 hours today fever went up to 102.8. No other changes

## 2018-04-24 NOTE — Telephone Encounter (Signed)
Patient states was told to call back today if symptoms changed. He states fever went up to 102.8 today.

## 2018-04-25 ENCOUNTER — Other Ambulatory Visit: Payer: Self-pay | Admitting: *Deleted

## 2018-04-25 ENCOUNTER — Telehealth: Payer: Self-pay | Admitting: Family Medicine

## 2018-04-25 MED ORDER — TOBRAMYCIN-DEXAMETHASONE 0.3-0.1 % OP SUSP: OPHTHALMIC | 0 refills | Status: DC

## 2018-04-25 MED ORDER — ALBUTEROL SULFATE HFA 108 (90 BASE) MCG/ACT IN AERS: 2.0000 | INHALATION_SPRAY | Freq: Four times a day (QID) | RESPIRATORY_TRACT | 0 refills | Status: DC | PRN

## 2018-04-25 NOTE — Telephone Encounter (Signed)
Pt's wife had an eye infection last week and now the pt has seemed to develop the same problem. Her doctor gave her a zpack and steroid eye drops. Hers was not pink eye but an eye infection and started in one eye and went to the other  His symptoms: eye redness, irritated, no itching and is getting some drainage out of his eye.   Still having the same symptoms as Thursday as well still wheezing.   Pt was seen on 04/23/18.   If able to call something in please send to [facility] - [place], [place] - [address].

## 2018-04-25 NOTE — Telephone Encounter (Signed)
Discussed with pt. Pt verbalized understanding. meds sent to pharm.  

## 2018-04-25 NOTE — Telephone Encounter (Signed)
tobradex drops (steroid plus antibiotic) two drops qid for next five d  May want to add albuterol inhaler with wheeziness  Two sprays up to qid prn for wheezing

## 2018-04-26 ENCOUNTER — Other Ambulatory Visit: Payer: Self-pay

## 2018-04-26 ENCOUNTER — Ambulatory Visit (INDEPENDENT_AMBULATORY_CARE_PROVIDER_SITE_OTHER): Payer: PRIVATE HEALTH INSURANCE

## 2018-04-26 ENCOUNTER — Ambulatory Visit (HOSPITAL_COMMUNITY)
Admission: EM | Admit: 2018-04-26 | Discharge: 2018-04-26 | Disposition: A | Discharge status: Home / Self Care | Payer: PRIVATE HEALTH INSURANCE | Attending: Family Medicine | Admitting: Family Medicine

## 2018-04-26 ENCOUNTER — Encounter (HOSPITAL_COMMUNITY): Payer: Self-pay | Admitting: Emergency Medicine

## 2018-04-26 DIAGNOSIS — J181 Lobar pneumonia, unspecified organism: Secondary | ICD-10-CM

## 2018-04-26 DIAGNOSIS — J189 Pneumonia, unspecified organism: Secondary | ICD-10-CM

## 2018-04-26 MED ORDER — PREDNISONE 20 MG PO TABS: 40.0000 mg | ORAL_TABLET | Freq: Every day | ORAL | 0 refills | Status: AC

## 2018-04-26 MED ORDER — HYDROCODONE-HOMATROPINE 5-1.5 MG/5ML PO SYRP: 5.0000 mL | ORAL_SOLUTION | Freq: Two times a day (BID) | ORAL | 0 refills | Status: DC | PRN

## 2018-04-26 MED ORDER — PREDNISONE 20 MG PO TABS: 20.0000 mg | ORAL_TABLET | Freq: Every day | ORAL | 0 refills | Status: DC

## 2018-04-26 MED ORDER — AZITHROMYCIN 250 MG PO TABS: ORAL_TABLET | ORAL | 0 refills | Status: DC

## 2018-04-26 NOTE — Discharge Instructions (Signed)
Your chest x-ray was abnormal. You will need to follow-up with your PCP 24 hours and obtain a repeat chest x-ray in 3-4 weeks.

## 2018-04-26 NOTE — ED Provider Notes (Addendum)
[facility]    CSN: [id number] Arrival date & time: 04/26/18  1706     History   Chief Complaint Chief Complaint  Patient presents with  . Cough  . Fever    HPI John Terrell is a 42 y.o. male.   HPI Acute Bronchitis: Patient presents for presents evaluation of chills, dyspnea, fever, productive cough, rhinorrhea and wheezing. Symptoms began 3 days ago and are gradually worsening since that time.  Past history is significant for no history of pneumonia or bronchitis. He was recently treated by his PCP with Doxycycline for sinus infection, however reports symptoms never improved and he feels he is getting worse. He is experiencing chest tightness and is concern as he is persistently wheezing and sweating. No known sick contact however recently traveled to [place] in [place] and this where symptoms began. Past Medical History:  Diagnosis Date  . Anemia   . GERD (gastroesophageal reflux disease)   . Hyperlipidemia   . Hyperlipidemia     Patient Active Problem List   Diagnosis Date Noted  . Absolute anemia 06/20/2017  . Dysphagia 03/11/2017  . Hypothyroidism 03/04/2017  . Reflux esophagitis 03/04/2017  . Elevated liver enzymes 09/11/2015  . Elevated TSH 09/11/2015  . Impaired fasting glucose 12/05/2014  . Hyperlipidemia LDL goal <130 06/07/2014  . Morbid obesity (HCC) 06/07/2014    Past Surgical History:  Procedure Laterality Date  . COLONOSCOPY N/A 07/12/2017   Procedure: COLONOSCOPY;  Surgeon: Malissa Hippo, MD;  Location: [facility];  Service: Endoscopy;  Laterality: N/A;  1:25  . ESOPHAGEAL DILATION N/A 03/11/2017   Procedure: ESOPHAGEAL DILATION;  Surgeon: Malissa Hippo, MD;  Location: [facility];  Service: Endoscopy;  Laterality: N/A;  . ESOPHAGOGASTRODUODENOSCOPY N/A 03/11/2017   Procedure: ESOPHAGOGASTRODUODENOSCOPY (EGD);  Surgeon: Malissa Hippo, MD;  Location: [facility];  Service: Endoscopy;  Laterality: N/A;  . HERNIA REPAIR  2008  - 2009    Home Medications    Prior to Admission medications   Medication Sig Start Date End Date Taking? Authorizing Provider  albuterol (PROVENTIL HFA;VENTOLIN HFA) 108 (90 Base) MCG/ACT inhaler Inhale 2 puffs into the lungs every 6 (six) hours as needed for wheezing or shortness of breath. 04/25/18  Yes Merlyn Albert, MD  doxycycline (VIBRA-TABS) 100 MG tablet Take 1 tablet (100 mg total) by mouth 2 (two) times daily. 04/23/18  Yes Merlyn Albert, MD  levothyroxine (SYNTHROID, LEVOTHROID) 125 MCG tablet Take 1 tablet (125 mcg total) by mouth daily. 03/31/18  Yes Merlyn Albert, MD  Pediatric Multivitamins-Iron (FLINTSTONES PLUS IRON) chewable tablet Chew 1 tablet by mouth 2 (two) times daily. 07/12/17  Yes Rehman, Joline Maxcy, MD  simvastatin (ZOCOR) 40 MG tablet Take 1 tablet (40 mg total) by mouth at bedtime. 03/31/18  Yes Merlyn Albert, MD  tobramycin-dexamethasone [facility]) ophthalmic solution 2 drops to affected eye qid for 5 days 04/25/18  Yes Merlyn Albert, MD    Family History Family History  Problem Relation Age of Onset  . Heart disease Father   . Heart disease Maternal Grandfather   . Heart disease Paternal Grandfather     Social History Social History   Tobacco Use  . Smoking status: Never Smoker  . Smokeless tobacco: Never Used  Substance Use Topics  . Alcohol use: Yes    Alcohol/week: 0.0 standard drinks  . Drug use: No     Allergies   Amoxil [amoxicillin]   Review of Systems Review of Systems  Pertinent negatives listed in HPI Physical Exam Triage Vital Signs ED Triage Vitals [04/26/18 1758]  Enc Vitals Group     BP (!) 149/87     Pulse Rate (!) 104     Resp 18     Temp 100.1 F (37.8 C)     Temp Source Oral     SpO2 95 %     Weight      Height      Head Circumference      Peak Flow      Pain Score      Pain Loc      Pain Edu?      Excl. in GC?    No data found.  Updated Vital Signs BP (!) 149/87 (BP Location: Left Arm)    Pulse (!) 104   Temp 100.1 F (37.8 C) (Oral)   Resp 18   SpO2 95%   Visual Acuity Right Eye Distance:   Left Eye Distance:   Bilateral Distance:    Right Eye Near:   Left Eye Near:    Bilateral Near:     Physical Exam  Constitutional: He has a sickly appearance.  HENT:  Head: Normocephalic.  Right Ear: External ear normal.  Left Ear: External ear normal.  Nose: Mucosal edema and rhinorrhea present.  Mouth/Throat: Oropharynx is clear and moist.  Eyes:  Bilateral eye redness. Negative of exudate.  Cardiovascular: Tachycardia present.  Pulmonary/Chest: He has wheezes. He has rhonchi.  Skin: Skin is warm. Capillary refill takes less than 2 seconds. He is diaphoretic.  Psychiatric: He has a normal mood and affect. His behavior is normal. Judgment and thought content normal.   UC Treatments / Results  Labs (all labs ordered are listed, but only abnormal results are displayed) Labs Reviewed - No data to display  EKG None  Radiology Dg Chest 2 View  Result Date: 04/26/2018 CLINICAL DATA:  Cough for several days EXAM: CHEST - 2 VIEW COMPARISON:  9/7/8 FINDINGS: Cardiac shadow is within normal limits. The lungs are well aerated bilaterally. There is some increased density projecting in the left upper lobe medially consistent with acute infiltrate given the patient's history. No other focal abnormality is noted. IMPRESSION: Medial left upper lobe infiltrate. Followup PA and lateral chest X-ray is recommended in 3-4 weeks following trial of antibiotic therapy to ensure resolution and exclude underlying malignancy. Electronically Signed   By: Alcide Clever M.D.   On: 04/26/2018 18:41    Procedures Procedures (including critical care time)  Medications Ordered in UC Medications - No data to display  Initial Impression / Assessment and Plan / UC Course  I have reviewed the triage vital signs and the nursing notes.  Pertinent labs & imaging results that were available during my  care of the patient were reviewed by me and considered in my medical decision making (see chart for details).    John Terrell presents today very ill appearing with audile wheezing, persistent cough, following recent antibiotic treatment for sinusitis. Symptoms have gradually worsened and imaging of chest-impression consistent with left upper lobe pneumonia. Will add Azithromycin and patient will continue doxycyline. For wheezing, prescribing prednisone x 5 days, prescribing hycodan for cough, recommend patient continue albuterol for wheezing and SOB. He is to contact PCP Monday to schedule a follow-up appointment given his presentation today. Red Flags discussed. Patient verbalized understanding and agreement with plan.  Final Clinical Impressions(s) / UC Diagnoses   Final diagnoses:  Pneumonia of left upper lobe due  to infectious organism [facility])     Discharge Instructions     Your chest x-ray was abnormal. You will need to follow-up with your PCP 24 hours and obtain a repeat chest x-ray in 3-4 weeks.    ED Prescriptions    Medication Sig Dispense Auth. Provider   azithromycin (ZITHROMAX) 250 MG tablet Take 2 tabs PO x 1 dose, then 1 tab PO QD x 4 days 6 tablet Bing Neighbors, FNP   HYDROcodone-homatropine (HYCODAN) 5-1.5 MG/5ML syrup Take 5 mLs by mouth every 12 (twelve) hours as needed for cough. 120 mL Bing Neighbors, FNP   predniSONE (DELTASONE) 20 MG tablet  (Status: Discontinued) Take 1 tablet (20 mg total) by mouth daily with breakfast for 5 days. Prednisone 20 mg, in mornings with breakfast as follows: Take 3 pills for 3 days, take 2 pills for 3 days, and take 1 pill for 3 days. 5 tablet Bing Neighbors, FNP   predniSONE (DELTASONE) 20 MG tablet Take 2 tablets (40 mg total) by mouth daily with breakfast for 5 days. 10 tablet Bing Neighbors, FNP     Controlled Substance Prescriptions [place] Controlled Substance Registry consulted? Yes, I have consulted the [place]  Controlled Substances Registry for this patient, and feel the risk/benefit ratio today is favorable for proceeding with this prescription for a controlled substance.   Bing Neighbors, FNP 04/29/18 2141    Bing Neighbors, FNP 04/29/18 2141

## 2018-04-26 NOTE — ED Triage Notes (Signed)
The patient presented to the [facility] with a complaint of a cough and fever x 5 days. The patient reported that he was seen at his PCP 4 days ago. The PCP prescribed and antibiotic and a rescue inhaler.

## 2018-04-28 ENCOUNTER — Encounter: Payer: Self-pay | Admitting: Family Medicine

## 2018-04-28 ENCOUNTER — Ambulatory Visit: Payer: PRIVATE HEALTH INSURANCE | Admitting: Family Medicine

## 2018-04-28 VITALS — Temp 98.6°F | Ht 73.0 in | Wt 377.6 lb

## 2018-04-28 DIAGNOSIS — J189 Pneumonia, unspecified organism: Secondary | ICD-10-CM | POA: Diagnosis not present

## 2018-04-28 NOTE — Progress Notes (Signed)
   Subjective:    Patient ID: John Terrell, male    DOB: 21-Mar-1976, 42 y.o.   MRN: [id number] Patient arrives with multiple concerns.  See prior note. HPI  Patient arrives for a follow up from a recent urgent care visit where he was diagnosed with pneumonia. Patient was given z pack and prednisone at his visit to urgent care 04/26/18. Patient states he is feeling some better since Saturday.  Pt had eye issues   Pt had tightness we called in vent inhaler  Pt was on doxy last wk re sinusitis and flu like syndrome.  Continue to have fever throughout the week.  Worsen towards the end of the week.  We called in inhaler.  Last Saturday patient was feeling some respiratory distress.  Was seen in urgent care.  They revealed a left upper lobe pneumonia.  Patient was given a azithromycin to add to his doxycycline.  Also had substantial wheezing at that time.  Was given steroids.  Did not start the steroids until last night.  Using inhaler 2 to 3 sprays every 4 hours at this time  Review of Systems No headache, no major weight loss or weight gain, no chest pain no back pain abdominal pain no change in bowel habits complete ROS otherwise negative     Objective:   Physical Exam   Alert and oriented, vitals reviewed and stable, NAD ENT-TM's and ext canals WNL bilat via otoscopic exam Soft palate, tonsils and post pharynx WNL via oropharyngeal exam Neck-symmetric, no masses; thyroid nonpalpable and nontender Pulmonary-no tachypnea or accessory muscle use; Diffuse substantial wheezes via auscultation Card--no abnrml murmurs, rhythm reg and rate WNL Carotid pulses symmetric, without bruits Chest x-ray reviewed.  Perihilar infiltrate on left    Assessment & Plan:  Impression 1 pneumonia.  Viral versus bacterial.  Differential discussed with patient.  Patient to maintain antibiotics.  2.  Status post either influenza or parainfluenza discussed at length  3.  Reactive airways.  Impressive  insubstantial.  Maintain steroids maintain inhaler.  Follow-up in 1 week.  Potential for long-term sequelae discussed  Greater than 50% of this 25 minute face to face visit was spent in counseling and discussion and coordination of care regarding the above diagnosis/diagnosies

## 2018-04-29 ENCOUNTER — Telehealth: Payer: Self-pay

## 2018-04-29 NOTE — Telephone Encounter (Signed)
I called and left a message asked that the pt r/c. 

## 2018-04-29 NOTE — Telephone Encounter (Signed)
Per Dr.Steve pt was seen this past Monday and will need to follow up on Next Monday or Wednesday.(He had told the pt Wednesday at the time he was seen on Monday).

## 2018-04-30 ENCOUNTER — Telehealth: Payer: Self-pay | Admitting: Family Medicine

## 2018-04-30 NOTE — Telephone Encounter (Signed)
Pt was inform to call today to give Dr.Scott an update on how he was feeling; pt states feeling better, slight wheezing still remains when he breathes, he is still continuing his medications, no fever, and pt seems to be eating okay as well.

## 2018-04-30 NOTE — Telephone Encounter (Signed)
Pt contacted and transferred up front to get appt for a one week follow up next Wednesday.

## 2018-04-30 NOTE — Telephone Encounter (Signed)
Pt has appt for next Wednesday with provider

## 2018-05-07 ENCOUNTER — Encounter: Payer: Self-pay | Admitting: Family Medicine

## 2018-05-07 ENCOUNTER — Ambulatory Visit: Payer: PRIVATE HEALTH INSURANCE | Admitting: Family Medicine

## 2018-05-07 VITALS — BP 132/78 | HR 80 | Temp 98.7°F

## 2018-05-07 DIAGNOSIS — J189 Pneumonia, unspecified organism: Secondary | ICD-10-CM

## 2018-05-07 DIAGNOSIS — J4520 Mild intermittent asthma, uncomplicated: Secondary | ICD-10-CM

## 2018-05-07 NOTE — Progress Notes (Signed)
Patient presents for follow-up.  See prior note.  Had substantial reactive airways.  This was accompanied by pneumonia.  Felt to arise after either the flu or parainfluenza.  Reports still occasional tightness.  Still occasional need for inhaler.  No longer productive cough no longer fever.  Still some fatigue.  Review of systems no headache no chest pain no excessive shortness of breath  Vitals see vital signs.  Alert active no acute distress.  Lungs no tachypnea no wheezes no crackles heart regular rate and rhythm HEENT  Impression status post   2.  Reactive airways clinically improved though not completely gone clinically.  Discussed.  PRN use discussed her graph symptom care discussed expect slow resolution of fatigue

## 2018-05-16 ENCOUNTER — Encounter: Payer: Self-pay | Admitting: Family Medicine

## 2018-05-21 ENCOUNTER — Ambulatory Visit (HOSPITAL_COMMUNITY)
Admission: RE | Admit: 2018-05-21 | Discharge: 2018-05-21 | Disposition: A | Discharge status: Home / Self Care | Payer: PRIVATE HEALTH INSURANCE | Source: Ambulatory Visit | Attending: Family Medicine | Admitting: Family Medicine

## 2018-05-21 DIAGNOSIS — J189 Pneumonia, unspecified organism: Secondary | ICD-10-CM | POA: Diagnosis present

## 2018-09-11 ENCOUNTER — Telehealth: Payer: PRIVATE HEALTH INSURANCE | Admitting: Physician Assistant

## 2018-09-11 DIAGNOSIS — J019 Acute sinusitis, unspecified: Secondary | ICD-10-CM

## 2018-09-11 DIAGNOSIS — B9789 Other viral agents as the cause of diseases classified elsewhere: Secondary | ICD-10-CM

## 2018-09-11 MED ORDER — IPRATROPIUM BROMIDE 0.03 % NA SOLN: 2.0000 | Freq: Two times a day (BID) | NASAL | 0 refills | Status: DC

## 2018-09-11 NOTE — Progress Notes (Signed)

## 2018-09-11 NOTE — Progress Notes (Signed)
I have spent 5 minutes in review of e-visit questionnaire, review and updating patient chart, medical decision making and response to patient.   Javonn Gauger Cody Ezekiel Menzer, PA-C    

## 2018-11-28 ENCOUNTER — Encounter: Payer: PRIVATE HEALTH INSURANCE | Admitting: Family Medicine

## 2018-12-06 ENCOUNTER — Other Ambulatory Visit: Payer: Self-pay | Admitting: Family Medicine

## 2018-12-09 NOTE — Telephone Encounter (Signed)
Call pt, time for p e and chronnic, if wants to do chronic only dan do virt, if want to do wellness plus chronic do face to face, then may ref times one

## 2018-12-09 NOTE — Telephone Encounter (Signed)
LVM TO CALL AND SCHEDULE VIRTUAL VISIT  °

## 2018-12-09 NOTE — Telephone Encounter (Signed)
Please call and schedule and then send back to nurses to fill med thanks

## 2018-12-17 NOTE — Telephone Encounter (Signed)
lvm for pt to schedule virtual visit  °

## 2018-12-22 ENCOUNTER — Ambulatory Visit (INDEPENDENT_AMBULATORY_CARE_PROVIDER_SITE_OTHER): Payer: PRIVATE HEALTH INSURANCE | Admitting: Family Medicine

## 2018-12-22 ENCOUNTER — Other Ambulatory Visit: Payer: Self-pay

## 2018-12-22 DIAGNOSIS — J329 Chronic sinusitis, unspecified: Secondary | ICD-10-CM | POA: Diagnosis not present

## 2018-12-22 MED ORDER — CEFDINIR 300 MG PO CAPS: ORAL_CAPSULE | ORAL | 0 refills | Status: DC

## 2018-12-22 NOTE — Progress Notes (Signed)
   Subjective:    Patient ID: John Terrell, male    DOB: 1975/12/02, 43 y.o.   MRN: [id number]  Sinus Problem This is a new problem. The current episode started in the past 7 days. Associated symptoms include congestion, coughing, headaches and sinus pressure. Pertinent negatives include no chills or ear pain.   He relates sinus pressure and pain also relates some postnasal drainage and some coughing from the drainage denies any chest congestion denies any fever or chills had temperature 99 earlier today but denied body aches chills sweats denies fatigue tiredness Patient does relate sinus headache on the frontal sinuses Review of Systems  Constitutional: Negative for activity change, chills and fever.  HENT: Positive for congestion, rhinorrhea and sinus pressure. Negative for ear pain.   Eyes: Negative for discharge.  Respiratory: Positive for cough. Negative for wheezing.   Cardiovascular: Negative for chest pain.  Gastrointestinal: Negative for nausea and vomiting.  Musculoskeletal: Negative for arthralgias.  Neurological: Positive for headaches.   Virtual Visit via Video Note  I connected with John Terrell on 12/22/18 at  1:10 PM EDT by a video enabled telemedicine application and verified that I am speaking with the correct person using two identifiers.  Location: Patient: home Provider: office   I discussed the limitations of evaluation and management by telemedicine and the availability of in person appointments. The patient expressed understanding and agreed to proceed.  History of Present Illness:    Observations/Objective:   Assessment and Plan:   Follow Up Instructions:    I discussed the assessment and treatment plan with the patient. The patient was provided an opportunity to ask questions and all were answered. The patient agreed with the plan and demonstrated an understanding of the instructions.   The patient was advised to call back or seek an  in-person evaluation if the symptoms worsen or if the condition fails to improve as anticipated.  I provided 15 minutes of non-face-to-face time during this encounter.        Objective:   Physical Exam Patient had virtual visit Appears to be in no distress Atraumatic Neuro able to relate and oriented No apparent resp distress Color normal  Patient was educated regarding COVID symptoms should the start occurring COVID testing would be recommended patient encouraged to try to do the best he can not to be around many people while he is sick      Assessment & Plan:  Patient has been working outside a fair amount More than likely has some secondary sinus going on May use OTC Allegra May use Flonase 2 sprays each nostril daily for the next couple weeks If progressive troubles or worse to follow-up Omnicef twice daily for 10 days has taken before Warning signs regarding COVID was reviewed if patient starts having these immediately notify us so we can order testing

## 2018-12-22 NOTE — Telephone Encounter (Signed)
Pt has CPE scheduled for 7/15 with Dr. Richardson Landry.

## 2018-12-29 ENCOUNTER — Other Ambulatory Visit: Payer: Self-pay | Admitting: Family Medicine

## 2018-12-29 MED ORDER — HYDROCODONE-HOMATROPINE 5-1.5 MG/5ML PO SYRP: ORAL_SOLUTION | ORAL | 0 refills | Status: DC

## 2018-12-29 NOTE — Telephone Encounter (Signed)
No fever, no sob, feeling better just needs something for cough at night. [facility]

## 2018-12-29 NOTE — Telephone Encounter (Signed)
Patient had a virtual visit on 6/29 and was put on an antibiotic for Sinuses.  Patient said sinus is better but still having a cough, especially at night.  Said it isn't a "Covid cough".  Not really coughing up anything.  Wants something called in for night time cough.  Larene Pickett

## 2018-12-29 NOTE — Telephone Encounter (Signed)
Medication pending. Left message to return call 

## 2018-12-29 NOTE — Telephone Encounter (Signed)
Hycodan 3 oz one tspn qhs prn cough 

## 2018-12-30 NOTE — Telephone Encounter (Signed)
Patient notified

## 2019-01-06 ENCOUNTER — Other Ambulatory Visit: Payer: Self-pay

## 2019-01-07 ENCOUNTER — Encounter: Payer: Self-pay | Admitting: Family Medicine

## 2019-01-07 ENCOUNTER — Ambulatory Visit (INDEPENDENT_AMBULATORY_CARE_PROVIDER_SITE_OTHER): Payer: PRIVATE HEALTH INSURANCE | Admitting: Family Medicine

## 2019-01-07 VITALS — BP 132/88 | Temp 97.4°F | Ht 73.0 in | Wt 391.8 lb

## 2019-01-07 DIAGNOSIS — E038 Other specified hypothyroidism: Secondary | ICD-10-CM | POA: Diagnosis not present

## 2019-01-07 DIAGNOSIS — Z79899 Other long term (current) drug therapy: Secondary | ICD-10-CM

## 2019-01-07 DIAGNOSIS — Z0000 Encounter for general adult medical examination without abnormal findings: Secondary | ICD-10-CM | POA: Diagnosis not present

## 2019-01-07 DIAGNOSIS — Z125 Encounter for screening for malignant neoplasm of prostate: Secondary | ICD-10-CM | POA: Diagnosis not present

## 2019-01-07 DIAGNOSIS — E785 Hyperlipidemia, unspecified: Secondary | ICD-10-CM | POA: Diagnosis not present

## 2019-01-07 DIAGNOSIS — R748 Abnormal levels of other serum enzymes: Secondary | ICD-10-CM

## 2019-01-07 MED ORDER — SIMVASTATIN 40 MG PO TABS: 40.0000 mg | ORAL_TABLET | Freq: Every day | ORAL | 1 refills | Status: DC

## 2019-01-07 MED ORDER — LEVOTHYROXINE SODIUM 125 MCG PO TABS: ORAL_TABLET | ORAL | 3 refills | Status: DC

## 2019-01-07 NOTE — Progress Notes (Signed)
Subjective:    Patient ID: John Terrell, male    DOB: September 13, 1975, 43 y.o.   MRN: [id number]  HPI The patient comes in today for a wellness visit.  Patient continues to take lipid medication regularly. No obvious side effects from it. Generally does not miss a dose. Prior blood work results are reviewed with patient. Patient continues to work on fat intake in diet  Compliant with thyroid    feling overall pretty good  Exercise too k a hot, going to the y stopped that     A review of their health history was completed.  A review of medications was also completed.  Any needed refills; refills on simvastatin and Synthroid  Eating habits: pt is trying to eat healthy  Falls/  MVA accidents in past few months: none  Regular exercise: was exercising regular until [place]  Specialist pt sees on regular basis: none  Preventative health issues were discussed.   Additional concerns: lingering sinus infection; wet cough at night  Patient continues to take lipid medication regularly. No obvious side effects from it. Generally does not miss a dose. Prior blood work results are reviewed with patient. Patient continues to work on fat intake in diet  Compliant with thyroid medicine.  Does not miss a dose. Review of Systems  Constitutional: Negative for activity change, appetite change and fever.  HENT: Negative for congestion and rhinorrhea.   Eyes: Negative for discharge.  Respiratory: Negative for cough and wheezing.   Cardiovascular: Negative for chest pain.  Gastrointestinal: Negative for abdominal pain, blood in stool and vomiting.  Genitourinary: Negative for difficulty urinating and frequency.  Musculoskeletal: Negative for neck pain.  Skin: Negative for rash.  Allergic/Immunologic: Negative for environmental allergies and food allergies.  Neurological: Negative for weakness and headaches.  Psychiatric/Behavioral: Negative for agitation.  All other systems reviewed and are  negative.      Objective:   Physical Exam Vitals signs reviewed.  Constitutional:      Appearance: He is well-developed.  HENT:     Head: Normocephalic and atraumatic.     Right Ear: External ear normal.     Left Ear: External ear normal.     Nose: Nose normal.  Eyes:     Pupils: Pupils are equal, round, and reactive to light.  Neck:     Musculoskeletal: Normal range of motion and neck supple.     Thyroid: No thyromegaly.  Cardiovascular:     Rate and Rhythm: Normal rate and regular rhythm.     Heart sounds: Normal heart sounds. No murmur.  Pulmonary:     Effort: Pulmonary effort is normal. No respiratory distress.     Breath sounds: Normal breath sounds. No wheezing.  Abdominal:     General: Bowel sounds are normal. There is no distension.     Palpations: Abdomen is soft. There is no mass.     Tenderness: There is no abdominal tenderness.  Genitourinary:    Penis: Normal.   Musculoskeletal: Normal range of motion.  Lymphadenopathy:     Cervical: No cervical adenopathy.  Skin:    General: Skin is warm and dry.     Findings: No erythema.  Neurological:     Mental Status: He is alert.     Motor: No abnormal muscle tone.  Psychiatric:        Behavior: Behavior normal.        Judgment: Judgment normal.           Assessment & Plan:  Impression 1 wellness exam.  Exercise discussed diet discussed.  Up-to-date on vaccines.  Blood work discussed and recommended  2.  Hyperlipidemia.  Exact status uncertain will check blood work  3.  Morbid obesity.  Serious issue for this patient.  He is in the midst of discussion with bariatric surgeon I have encouraged him to keep moving in the right direction this way  4.  Lingering cough post sinusitis exam completely normal likely rosacea however and did not recommend further antibiotics  Follow-up in 6 months diet exercise discussed further recommendations based on blood work

## 2019-02-09 ENCOUNTER — Ambulatory Visit (INDEPENDENT_AMBULATORY_CARE_PROVIDER_SITE_OTHER): Payer: PRIVATE HEALTH INSURANCE | Admitting: Otolaryngology

## 2019-02-09 DIAGNOSIS — H6121 Impacted cerumen, right ear: Secondary | ICD-10-CM | POA: Diagnosis not present

## 2019-04-02 ENCOUNTER — Encounter: Payer: Self-pay | Admitting: Family Medicine

## 2019-05-09 LAB — CBC WITH DIFFERENTIAL/PLATELET
Basophils Absolute: 0.1 10*3/uL (ref 0.0–0.2)
Basos: 1 %
EOS (ABSOLUTE): 0.3 10*3/uL (ref 0.0–0.4)
Eos: 4 %
Hematocrit: 41.6 % (ref 37.5–51.0)
Hemoglobin: 13.4 g/dL (ref 13.0–17.7)
Immature Grans (Abs): 0 10*3/uL (ref 0.0–0.1)
Immature Granulocytes: 0 %
Lymphocytes Absolute: 1.7 10*3/uL (ref 0.7–3.1)
Lymphs: 23 %
MCH: 25.6 pg — ABNORMAL LOW (ref 26.6–33.0)
MCHC: 32.2 g/dL (ref 31.5–35.7)
MCV: 80 fL (ref 79–97)
Monocytes Absolute: 0.5 10*3/uL (ref 0.1–0.9)
Monocytes: 6 %
Neutrophils Absolute: 4.8 10*3/uL (ref 1.4–7.0)
Neutrophils: 66 %
Platelets: 293 10*3/uL (ref 150–450)
RBC: 5.23 x10E6/uL (ref 4.14–5.80)
RDW: 14.2 % (ref 11.6–15.4)
WBC: 7.3 10*3/uL (ref 3.4–10.8)

## 2019-05-09 LAB — BASIC METABOLIC PANEL WITH GFR
BUN/Creatinine Ratio: 17 (ref 9–20)
BUN: 15 mg/dL (ref 6–24)
CO2: 22 mmol/L (ref 20–29)
Calcium: 9.3 mg/dL (ref 8.7–10.2)
Chloride: 106 mmol/L (ref 96–106)
Creatinine, Ser: 0.89 mg/dL (ref 0.76–1.27)
GFR calc Af Amer: 123 mL/min/{1.73_m2}
GFR calc non Af Amer: 106 mL/min/{1.73_m2}
Glucose: 111 mg/dL — ABNORMAL HIGH (ref 65–99)
Potassium: 4.4 mmol/L (ref 3.5–5.2)
Sodium: 142 mmol/L (ref 134–144)

## 2019-05-09 LAB — HEPATIC FUNCTION PANEL
ALT: 17 [IU]/L (ref 0–44)
AST: 14 [IU]/L (ref 0–40)
Albumin: 4.5 g/dL (ref 4.0–5.0)
Alkaline Phosphatase: 106 [IU]/L (ref 39–117)
Bilirubin Total: 0.5 mg/dL (ref 0.0–1.2)
Bilirubin, Direct: 0.14 mg/dL (ref 0.00–0.40)
Total Protein: 6.8 g/dL (ref 6.0–8.5)

## 2019-05-09 LAB — LIPID PANEL
Chol/HDL Ratio: 4.8 ratio (ref 0.0–5.0)
Cholesterol, Total: 177 mg/dL (ref 100–199)
HDL: 37 mg/dL — ABNORMAL LOW (ref >39)
LDL Chol Calc (NIH): 124 mg/dL — ABNORMAL HIGH (ref 0–99)
Triglycerides: 86 mg/dL (ref 0–149)
VLDL Cholesterol Cal: 16 mg/dL (ref 5–40)

## 2019-05-09 LAB — PSA: Prostate Specific Ag, Serum: 0.6 ng/mL (ref 0.0–4.0)

## 2019-05-12 ENCOUNTER — Encounter: Payer: Self-pay | Admitting: Family Medicine

## 2019-05-15 ENCOUNTER — Other Ambulatory Visit: Payer: Self-pay | Admitting: Family Medicine

## 2019-06-29 ENCOUNTER — Ambulatory Visit: Payer: PRIVATE HEALTH INSURANCE | Attending: Internal Medicine

## 2019-06-29 ENCOUNTER — Other Ambulatory Visit: Payer: Self-pay

## 2019-06-29 DIAGNOSIS — Z20822 Contact with and (suspected) exposure to covid-19: Secondary | ICD-10-CM

## 2019-06-30 LAB — NOVEL CORONAVIRUS, NAA: SARS-CoV-2, NAA: NOT DETECTED

## 2019-07-13 ENCOUNTER — Encounter: Payer: Self-pay | Admitting: Family Medicine

## 2019-07-27 ENCOUNTER — Encounter: Payer: Self-pay | Admitting: Family Medicine

## 2019-09-01 ENCOUNTER — Telehealth: Payer: Self-pay | Admitting: Family Medicine

## 2019-09-01 DIAGNOSIS — Z0000 Encounter for general adult medical examination without abnormal findings: Secondary | ICD-10-CM

## 2019-09-01 DIAGNOSIS — Z125 Encounter for screening for malignant neoplasm of prostate: Secondary | ICD-10-CM

## 2019-09-01 DIAGNOSIS — E785 Hyperlipidemia, unspecified: Secondary | ICD-10-CM

## 2019-09-01 NOTE — Telephone Encounter (Signed)
Patient has physical  5/5 and would like to get labs done.

## 2019-09-01 NOTE — Telephone Encounter (Signed)
Last labs completed on 05/08/2019 CBC, PSA, BMET, Hepatic and Lipid. Please advise. Thank you

## 2019-09-06 NOTE — Telephone Encounter (Signed)
Repeat same 

## 2019-09-07 NOTE — Telephone Encounter (Signed)
Orders put in and mailed to pt to do before physical in may and pt was called and notified.

## 2019-09-11 ENCOUNTER — Other Ambulatory Visit: Payer: Self-pay

## 2019-09-11 ENCOUNTER — Ambulatory Visit (INDEPENDENT_AMBULATORY_CARE_PROVIDER_SITE_OTHER): Payer: 59 | Admitting: Family Medicine

## 2019-09-11 DIAGNOSIS — E039 Hypothyroidism, unspecified: Secondary | ICD-10-CM

## 2019-09-11 DIAGNOSIS — Z125 Encounter for screening for malignant neoplasm of prostate: Secondary | ICD-10-CM

## 2019-09-11 DIAGNOSIS — E785 Hyperlipidemia, unspecified: Secondary | ICD-10-CM

## 2019-09-11 DIAGNOSIS — Z79899 Other long term (current) drug therapy: Secondary | ICD-10-CM

## 2019-09-11 MED ORDER — LEVOTHYROXINE SODIUM 125 MCG PO TABS: ORAL_TABLET | ORAL | 11 refills | Status: DC

## 2019-09-11 MED ORDER — SIMVASTATIN 40 MG PO TABS: ORAL_TABLET | ORAL | 11 refills | Status: DC

## 2019-09-11 NOTE — Progress Notes (Signed)
   Subjective:  Audio only  Patient ID: John Terrell, male    DOB: May 14, 1976, 44 y.o.   MRN: [id number]  HPI  Patient calls today for medication refill. Patient states he is doing well, diet is pretty good and he has been walking outside.   No problems or concerns today.  Virtual Visit via Video Note  I connected with [name] on 09/11/19 at  9:30 AM EDT by a video enabled telemedicine application and verified that I am speaking with the correct person using two identifiers.  Location: Patient: home Provider: office    I discussed the limitations of evaluation and management by telemedicine and the availability of in person appointments. The patient expressed understanding and agreed to proceed.  History of Present Illness:    Observations/Objective:   Assessment and Plan:   Follow Up Instructions:    I discussed the assessment and treatment plan with the patient. The patient was provided an opportunity to ask questions and all were answered. The patient agreed with the plan and demonstrated an understanding of the instructions.   The patient was advised to call back or seek an in-person evaluation if the symptoms worsen or if the condition fails to improve as anticipated.  I provided 22 minutes of non-face-to-face time during this encounter.  Patient continues to take lipid medication regularly. No obvious side effects from it. Generally does not miss a dose. Prior blood work results are reviewed with patient. Patient continues to work on fat intake in diet     Review of Systems No headache no chest pain no shortness of breath    Objective:   Physical Exam   Virtual     Assessment & Plan:  Impression hyperlipidemia.  Discussed.  Patient to maintain same medication.  Diet exercise discussed and encouraged  2.  Hypothyroidism.  Patient is get blood work sent before physical in May  Medications refilled.  Questions answered

## 2019-10-21 LAB — CBC WITH DIFFERENTIAL/PLATELET
Basophils Absolute: 0.1 10*3/uL (ref 0.0–0.2)
Basos: 1 %
EOS (ABSOLUTE): 0.3 10*3/uL (ref 0.0–0.4)
Eos: 4 %
Hematocrit: 40.9 % (ref 37.5–51.0)
Hemoglobin: 13.1 g/dL (ref 13.0–17.7)
Immature Grans (Abs): 0 10*3/uL (ref 0.0–0.1)
Immature Granulocytes: 0 %
Lymphocytes Absolute: 1.8 10*3/uL (ref 0.7–3.1)
Lymphs: 26 %
MCH: 26 pg — ABNORMAL LOW (ref 26.6–33.0)
MCHC: 32 g/dL (ref 31.5–35.7)
MCV: 81 fL (ref 79–97)
Monocytes Absolute: 0.6 10*3/uL (ref 0.1–0.9)
Monocytes: 9 %
Neutrophils Absolute: 4.1 10*3/uL (ref 1.4–7.0)
Neutrophils: 60 %
Platelets: 259 10*3/uL (ref 150–450)
RBC: 5.04 x10E6/uL (ref 4.14–5.80)
RDW: 14.7 % (ref 11.6–15.4)
WBC: 6.8 10*3/uL (ref 3.4–10.8)

## 2019-10-21 LAB — LIPID PANEL
Chol/HDL Ratio: 4.5 ratio (ref 0.0–5.0)
Cholesterol, Total: 156 mg/dL (ref 100–199)
HDL: 35 mg/dL — ABNORMAL LOW (ref >39)
LDL Chol Calc (NIH): 110 mg/dL — ABNORMAL HIGH (ref 0–99)
Triglycerides: 55 mg/dL (ref 0–149)
VLDL Cholesterol Cal: 11 mg/dL (ref 5–40)

## 2019-10-21 LAB — BASIC METABOLIC PANEL
BUN/Creatinine Ratio: 15 (ref 9–20)
BUN: 13 mg/dL (ref 6–24)
CO2: 24 mmol/L (ref 20–29)
Calcium: 9.1 mg/dL (ref 8.7–10.2)
Chloride: 104 mmol/L (ref 96–106)
Creatinine, Ser: 0.87 mg/dL (ref 0.76–1.27)
GFR calc Af Amer: 124 mL/min/{1.73_m2} (ref >59)
GFR calc non Af Amer: 107 mL/min/{1.73_m2} (ref >59)
Glucose: 96 mg/dL (ref 65–99)
Potassium: 4.3 mmol/L (ref 3.5–5.2)
Sodium: 142 mmol/L (ref 134–144)

## 2019-10-21 LAB — HEPATIC FUNCTION PANEL
ALT: 19 IU/L (ref 0–44)
AST: 15 IU/L (ref 0–40)
Albumin: 4.3 g/dL (ref 4.0–5.0)
Alkaline Phosphatase: 97 IU/L (ref 39–117)
Bilirubin Total: 0.5 mg/dL (ref 0.0–1.2)
Bilirubin, Direct: 0.18 mg/dL (ref 0.00–0.40)
Total Protein: 6.4 g/dL (ref 6.0–8.5)

## 2019-10-21 LAB — PSA: Prostate Specific Ag, Serum: 0.9 ng/mL (ref 0.0–4.0)

## 2019-10-21 LAB — TSH: TSH: 2.08 u[IU]/mL (ref 0.450–4.500)

## 2019-10-28 ENCOUNTER — Ambulatory Visit (INDEPENDENT_AMBULATORY_CARE_PROVIDER_SITE_OTHER): Payer: 59 | Admitting: Family Medicine

## 2019-10-28 ENCOUNTER — Other Ambulatory Visit: Payer: Self-pay

## 2019-10-28 VITALS — BP 128/84 | Temp 97.1°F | Wt 391.0 lb

## 2019-10-28 DIAGNOSIS — Z0000 Encounter for general adult medical examination without abnormal findings: Secondary | ICD-10-CM

## 2019-10-28 MED ORDER — LEVOTHYROXINE SODIUM 125 MCG PO TABS: ORAL_TABLET | ORAL | 11 refills | Status: AC

## 2019-10-28 MED ORDER — SIMVASTATIN 40 MG PO TABS: ORAL_TABLET | ORAL | 5 refills | Status: AC

## 2019-10-28 NOTE — Progress Notes (Signed)
Subjective:    Patient ID: John Terrell, male    DOB: [birth year], 44 y.o.   MRN: [id number]  HPI  The patient comes in today for a wellness visit.    A review of their health history was completed.  A review of medications was also completed.  Any needed refills; yes   Eating habits: pretty good   Falls/  MVA accidents in past few months: none  Regular exercise: walking  Specialist pt sees on regular basis: none  Preventative health issues were discussed.   Additional concerns: none  Results for orders placed or performed in visit on 09/11/19  Hepatic function panel  Result Value Ref Range   Total Protein 6.4 6.0 - 8.5 g/dL   Albumin 4.3 4.0 - 5.0 g/dL   Bilirubin Total 0.5 0.0 - 1.2 mg/dL   Bilirubin, Direct 0.18 0.00 - 0.40 mg/dL   Alkaline Phosphatase 97 39 - 117 IU/L   AST 15 0 - 40 IU/L   ALT 19 0 - 44 IU/L  Lipid panel  Result Value Ref Range   Cholesterol, Total 156 100 - 199 mg/dL   Triglycerides 55 0 - 149 mg/dL   HDL 35 (L) >39 mg/dL   VLDL Cholesterol Cal 11 5 - 40 mg/dL   LDL Chol Calc (NIH) 110 (H) 0 - 99 mg/dL   Chol/HDL Ratio 4.5 0.0 - 5.0 ratio  Basic metabolic panel  Result Value Ref Range   Glucose 96 65 - 99 mg/dL   BUN 13 6 - 24 mg/dL   Creatinine, Ser 0.87 0.76 - 1.27 mg/dL   GFR calc non Af Amer 107 >59 mL/min/1.73   GFR calc Af Amer 124 >59 mL/min/1.73   BUN/Creatinine Ratio 15 9 - 20   Sodium 142 134 - 144 mmol/L   Potassium 4.3 3.5 - 5.2 mmol/L   Chloride 104 96 - 106 mmol/L   CO2 24 20 - 29 mmol/L   Calcium 9.1 8.7 - 10.2 mg/dL  PSA  Result Value Ref Range   Prostate Specific Ag, Serum 0.9 0.0 - 4.0 ng/mL  CBC with Differential/Platelet  Result Value Ref Range   WBC 6.8 3.4 - 10.8 x10E3/uL   RBC 5.04 4.14 - 5.80 x10E6/uL   Hemoglobin 13.1 13.0 - 17.7 g/dL   Hematocrit 40.9 37.5 - 51.0 %   MCV 81 79 - 97 fL   MCH 26.0 (L) 26.6 - 33.0 pg   MCHC 32.0 31.5 - 35.7 g/dL   RDW 14.7 11.6 - 15.4 %   Platelets 259 150 - 450  x10E3/uL   Neutrophils 60 Not Estab. %   Lymphs 26 Not Estab. %   Monocytes 9 Not Estab. %   Eos 4 Not Estab. %   Basos 1 Not Estab. %   Neutrophils Absolute 4.1 1.4 - 7.0 x10E3/uL   Lymphocytes Absolute 1.8 0.7 - 3.1 x10E3/uL   Monocytes Absolute 0.6 0.1 - 0.9 x10E3/uL   EOS (ABSOLUTE) 0.3 0.0 - 0.4 x10E3/uL   Basophils Absolute 0.1 0.0 - 0.2 x10E3/uL   Immature Granulocytes 0 Not Estab. %   Immature Grans (Abs) 0.0 0.0 - 0.1 x10E3/uL  TSH  Result Value Ref Range   TSH 2.080 0.450 - 4.500 uIU/mL   Patient continues to take lipid medication regularly. No obvious side effects from it. Generally does not miss a dose. Prior blood work results are reviewed with patient. Patient continues to work on fat intake in diet  Watching diet better, eating out some  Salads and fgrilled chicken with [employer] with the family, gets outdoors a lot    moderna times tow       Review of Systems No headache, no major weight loss or weight gain, no chest pain no back pain abdominal pain no change in bowel habits complete ROS otherwise negative     Objective:   Physical Exam Alert and oriented, vitals reviewed and stable, NAD ENT-TM's and ext canals WNL bilat via otoscopic exam Soft palate, tonsils and post pharynx WNL via oropharyngeal exam Neck-symmetric, no masses; thyroid nonpalpable and nontender Pulmonary-no tachypnea or accessory muscle use; Clear without wheezes via auscultation Card--no abnrml murmurs, rhythm reg and rate WNL Carotid pulses symmetric, without bruits Morbid obesity present  Testicular exam normal.  Prostate exam within normal limits       Assessment & Plan:  Impression 1 wellness exam.  Morbid obesity discussed once again.  Patient is finally coming around on potential bariatric interventions.  I challenged him to get on with this by next year if he does not achieve substantial improvement with his own efforts has had a Covid vaccine diet exercise  discussed  2.  Hyperlipidemia good control discussed maintain same meds  3.  Hypothyroidism excellent control to maintain same for year  Encouraged to get a new primary care physician in his new home [facility]

## 2020-08-11 IMAGING — DX DG CHEST 2V
2 series · 2 of 2 positions shown · non-contrast
Comparison: PA and lateral chest 04/26/2018.

CLINICAL DATA: History of left upper lobe pneumonia diagnosed 3-4
weeks ago.

EXAM:
CHEST - 2 VIEW

[chest pa]
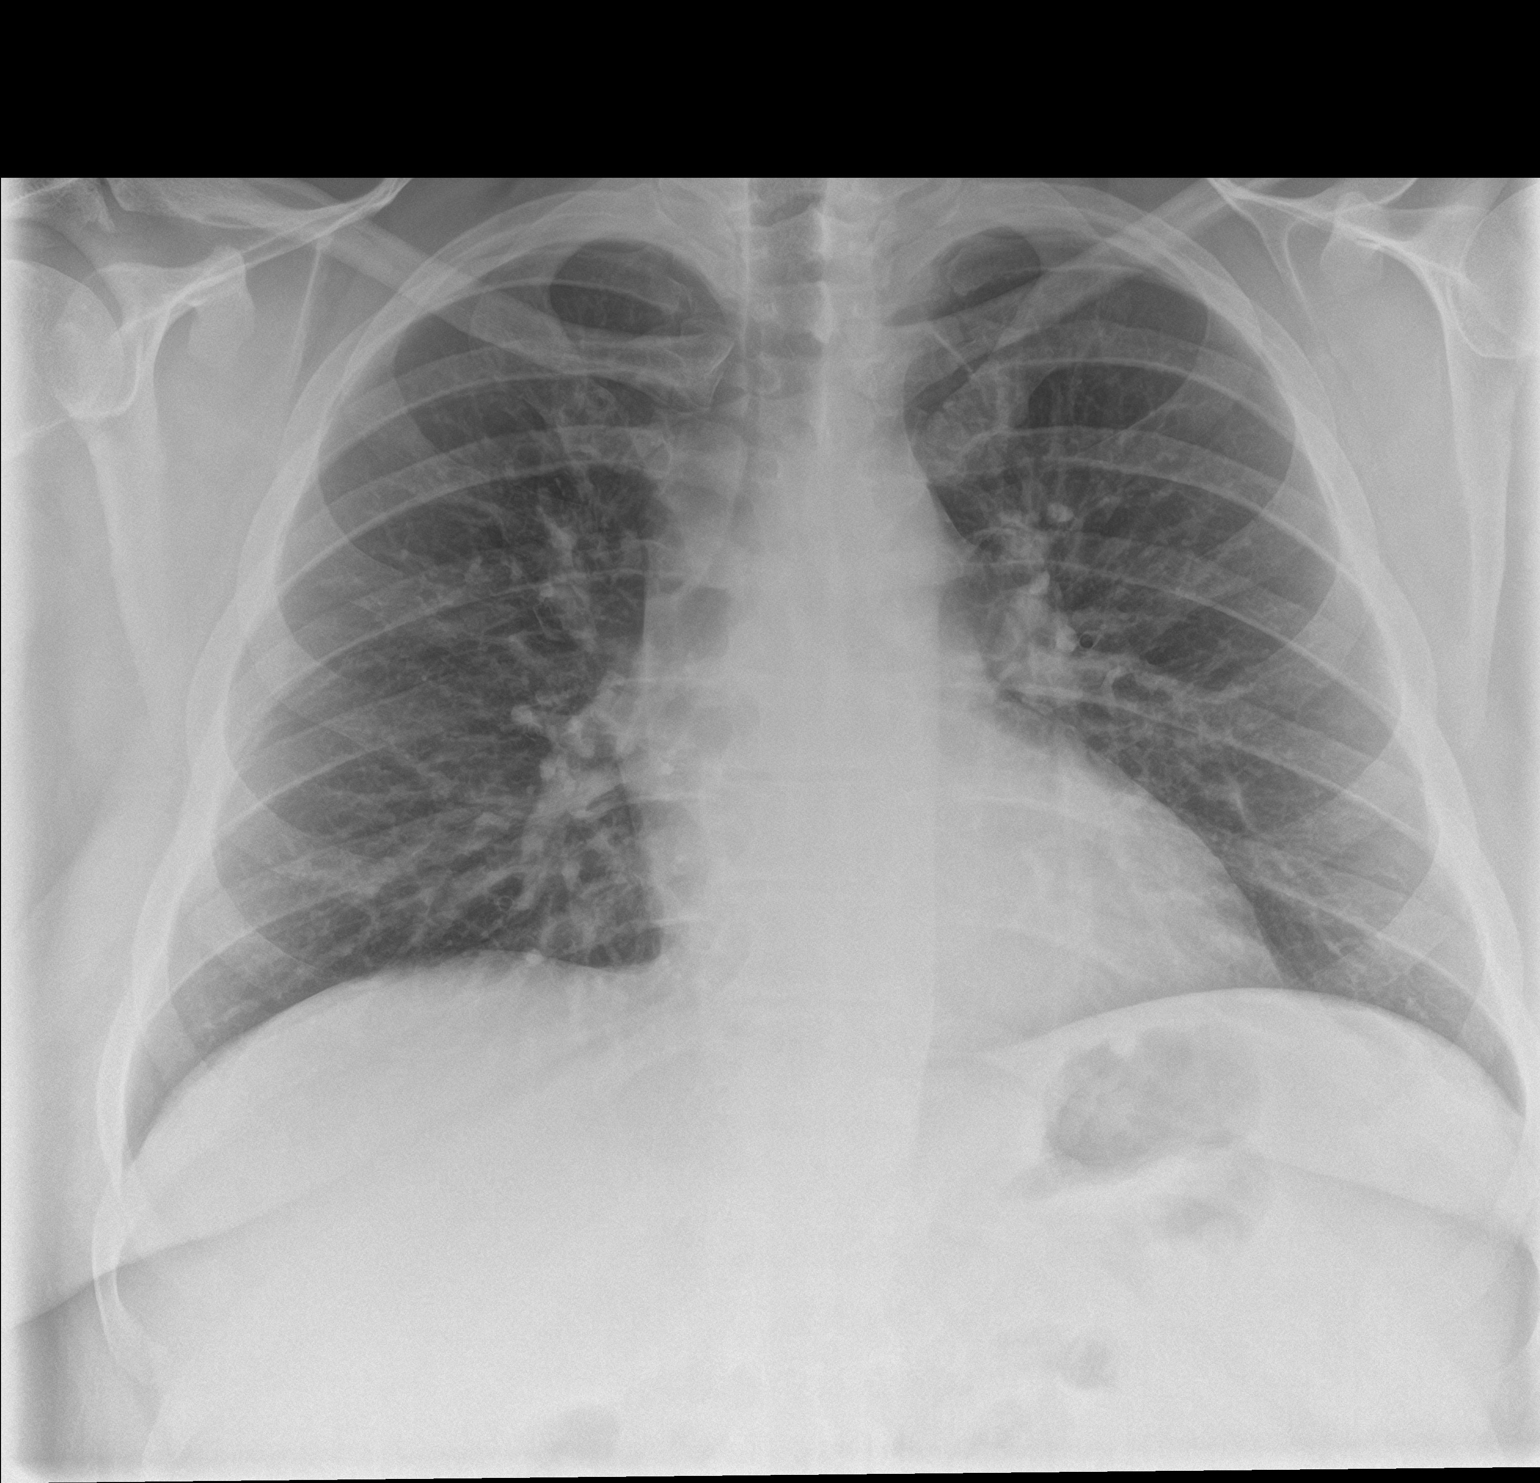

[chest lat]
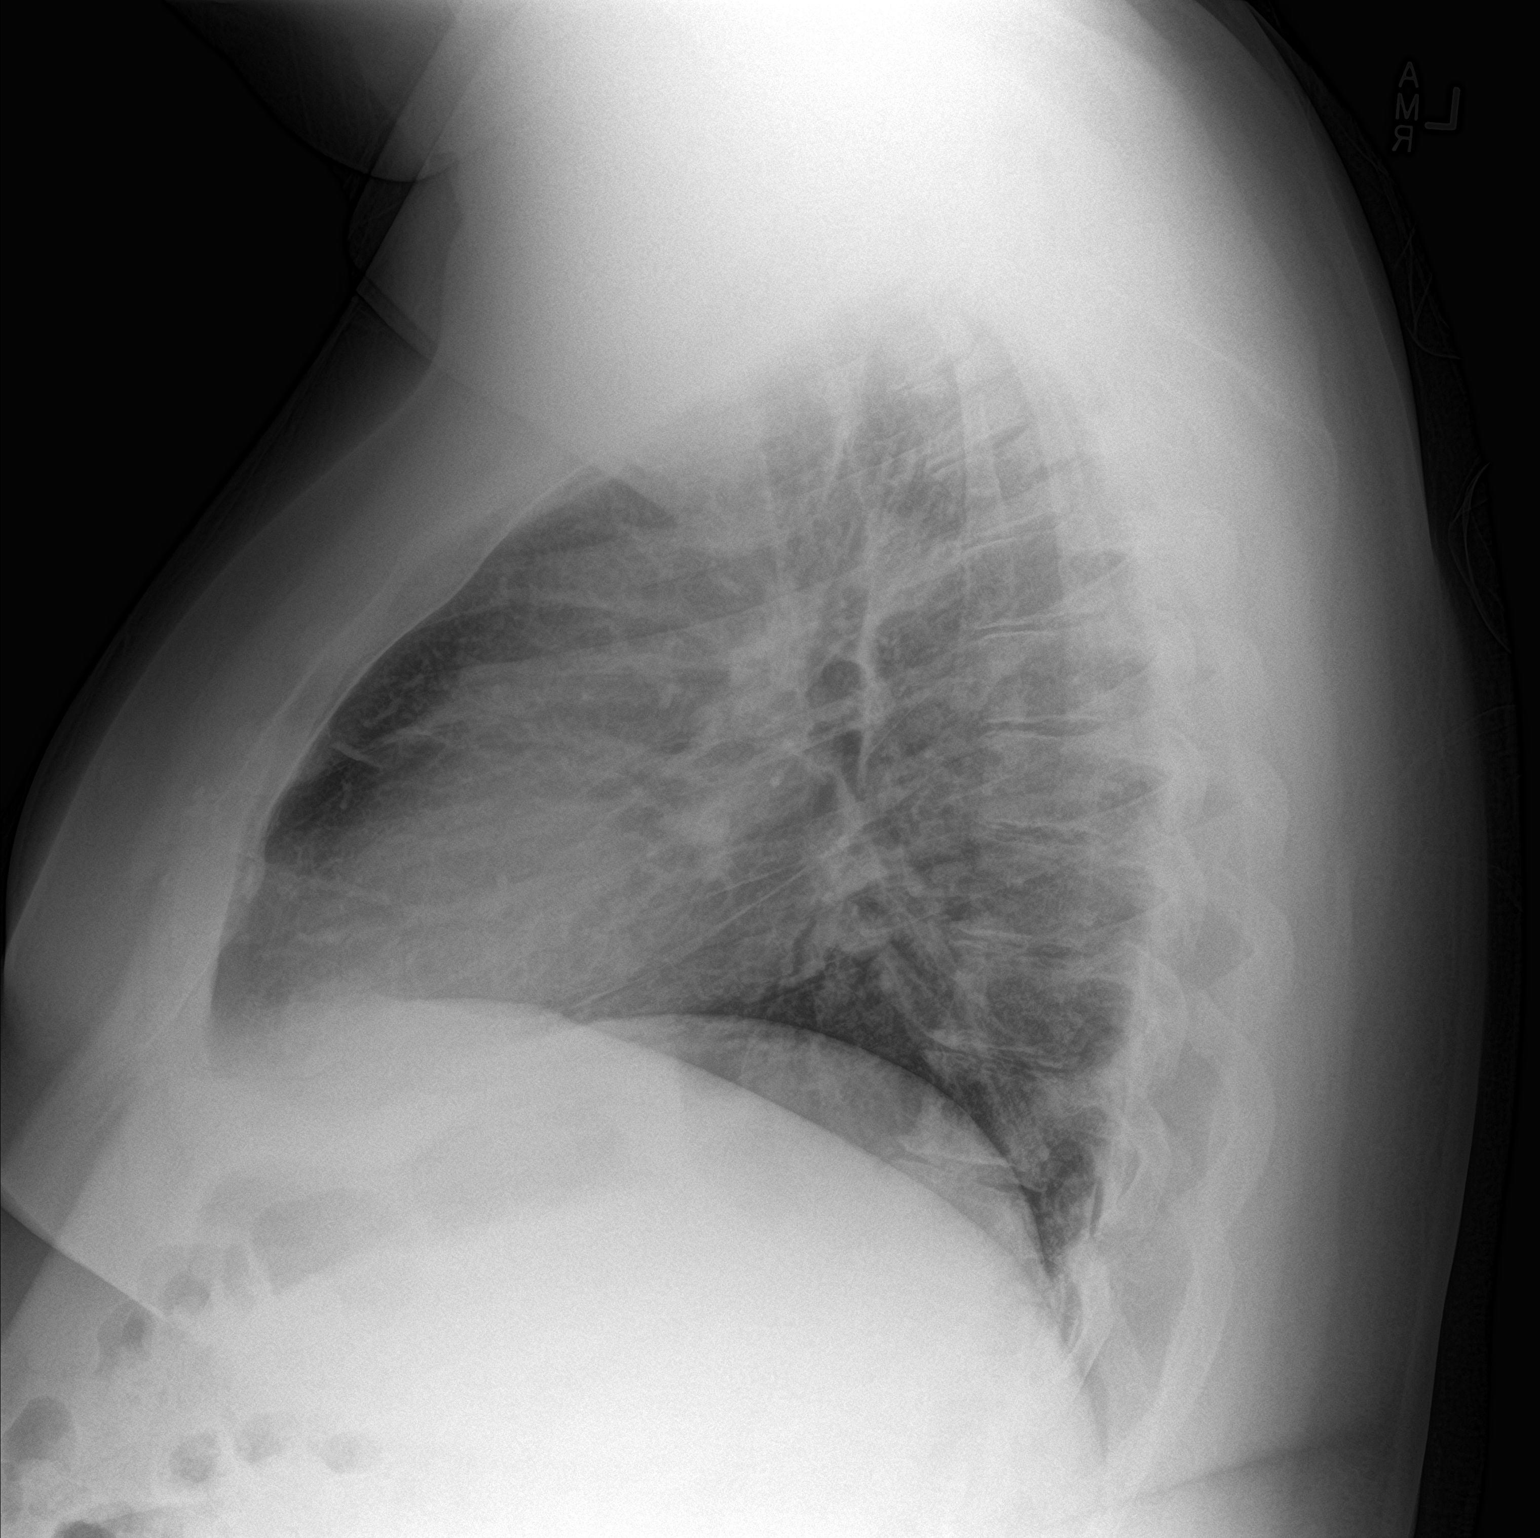

[2 of 2 positions shown; findings below may reference images not displayed]

FINDINGS: Airspace disease in the left chest has resolved since the prior
examination. Lungs are clear. Heart size is normal. No pneumothorax
or pleural fluid. No acute or focal bony abnormality.
IMPRESSION: Resolved pneumonia.  No acute disease.

## 2021-03-03 DIAGNOSIS — R7989 Other specified abnormal findings of blood chemistry: Secondary | ICD-10-CM | POA: Insufficient documentation

## 2021-04-03 ENCOUNTER — Ambulatory Visit
Admission: RE | Admit: 2021-04-03 | Discharge: 2021-04-03 | Disposition: A | Discharge status: Home / Self Care | Payer: 59 | Source: Ambulatory Visit | Attending: Physician Assistant | Admitting: Physician Assistant

## 2021-04-03 ENCOUNTER — Other Ambulatory Visit: Payer: Self-pay

## 2021-04-03 VITALS — BP 127/71 | HR 87 | Temp 98.4°F | Resp 20

## 2021-04-03 DIAGNOSIS — H00012 Hordeolum externum right lower eyelid: Secondary | ICD-10-CM | POA: Diagnosis not present

## 2021-04-03 MED ORDER — ERYTHROMYCIN 5 MG/GM OP OINT: TOPICAL_OINTMENT | OPHTHALMIC | 0 refills | Status: AC

## 2021-04-03 NOTE — ED Provider Notes (Signed)
[facility]    CSN: [id number] Arrival date & time: 04/03/21  1842      History   Chief Complaint Chief Complaint  Patient presents with   Facial Swelling    Right lower    HPI John Terrell is a 45 y.o. male presenting for 2-day history of redness and swelling of the right lower eyelid.  He also says it is painful.  No vision changes.  No drainage from the eye.  No fevers or significant facial swelling.  No congestion.  He has applied cool compresses and warm compresses without improvement in symptoms.  Patient wears glasses.  Does not wear contact lenses.  No injury to eye.  No other complaints.  HPI  Past Medical History:  Diagnosis Date   Anemia    GERD (gastroesophageal reflux disease)    Hyperlipidemia    Hyperlipidemia     Patient Active Problem List   Diagnosis Date Noted   Absolute anemia 06/20/2017   Dysphagia 03/11/2017   Hypothyroidism 03/04/2017   Reflux esophagitis 03/04/2017   Elevated liver enzymes 09/11/2015   Elevated TSH 09/11/2015   Impaired fasting glucose 12/05/2014   Hyperlipidemia LDL goal <130 06/07/2014   Morbid obesity (HCC) 06/07/2014    Past Surgical History:  Procedure Laterality Date   COLONOSCOPY N/A 07/12/2017   Procedure: COLONOSCOPY;  Surgeon: Malissa Hippo, MD;  [facility]: [facility];  Service: Endoscopy;  Laterality: N/A;  1:25   ESOPHAGEAL DILATION N/A 03/11/2017   Procedure: ESOPHAGEAL DILATION;  Surgeon: Malissa Hippo, MD;  Location: [facility];  Service: Endoscopy;  Laterality: N/A;   ESOPHAGOGASTRODUODENOSCOPY N/A 03/11/2017   Procedure: ESOPHAGOGASTRODUODENOSCOPY (EGD);  Surgeon: Malissa Hippo, MD;  Location: [facility];  Service: Endoscopy;  Laterality: N/A;   HERNIA REPAIR  2008 - 2009       Home Medications    Prior to Admission medications   Medication Sig Start Date End Date Taking? Authorizing Provider  erythromycin ophthalmic ointment Place a 1/2 inch ribbon of ointment into  the lower eyelid q6h 04/03/21  Yes Shirlee Latch, PA-C  levothyroxine (SYNTHROID) 125 MCG tablet TAKE (1) TABLET BY MOUTH ONCE DAILY. 10/28/19   Merlyn Albert, MD  Multiple Vitamin (MULTIVITAMIN WITH MINERALS) TABS tablet Take 1 tablet by mouth daily.    [provider]  simvastatin (ZOCOR) 40 MG tablet TAKE (1) TABLET BY MOUTH AT BEDTIME. 10/28/19   Merlyn Albert, MD    Family History Family History  Problem Relation Age of Onset   Heart disease Father    Heart disease Maternal Grandfather    Heart disease Paternal Grandfather     Social History Social History   Tobacco Use   Smoking status: Never   Smokeless tobacco: Never  Vaping Use   Vaping Use: Never used  Substance Use Topics   Alcohol use: Yes    Alcohol/week: 0.0 standard drinks   Drug use: No     Allergies   Amoxil [amoxicillin]   Review of Systems Review of Systems  Constitutional:  Negative for fatigue and fever.  HENT:  Negative for congestion, facial swelling, rhinorrhea and sore throat.   Eyes:  Positive for pain. Negative for photophobia, discharge, redness, itching and visual disturbance.  Respiratory:  Negative for cough.   Skin:  Positive for color change. Negative for rash.    Physical Exam Triage Vital Signs ED Triage Vitals  Enc Vitals Group     BP 04/03/21 1911 127/71  Pulse Rate 04/03/21 1907 87     Resp 04/03/21 1907 20     Temp 04/03/21 1907 98.4 F (36.9 C)     Temp Source 04/03/21 1907 Oral     SpO2 04/03/21 1907 98 %     Weight --      Height --      Head Circumference --      Peak Flow --      Pain Score 04/03/21 1904 2     Pain Loc --      Pain Edu? --      Excl. in GC? --    No data found.  Updated Vital Signs BP 127/71   Pulse 87   Temp 98.4 F (36.9 C) (Oral)   Resp 20   SpO2 98%      Physical Exam Vitals and nursing note reviewed.  Constitutional:      General: He is not in acute distress.    Appearance: Normal appearance. He is  well-developed. He is not ill-appearing.  HENT:     Head: Normocephalic and atraumatic.  Eyes:     General: No scleral icterus.    Conjunctiva/sclera: Conjunctivae normal.     Comments: Mild swelling right lower eyelid. Stye right inner lower eyelid  Cardiovascular:     Rate and Rhythm: Normal rate.  Pulmonary:     Effort: Pulmonary effort is normal. No respiratory distress.  Musculoskeletal:     Cervical back: Neck supple.  Skin:    General: Skin is warm and dry.  Neurological:     General: No focal deficit present.     Mental Status: He is alert. Mental status is at baseline.     Motor: No weakness.     Gait: Gait normal.  Psychiatric:        Mood and Affect: Mood normal.        Behavior: Behavior normal.        Thought Content: Thought content normal.     UC Treatments / Results  Labs (all labs ordered are listed, but only abnormal results are displayed) Labs Reviewed - No data to display  EKG   Radiology No results found.  Procedures Procedures (including critical care time)  Medications Ordered in UC Medications - No data to display  Initial Impression / Assessment and Plan / UC Course  I have reviewed the triage vital signs and the nursing notes.  Pertinent labs & imaging results that were available during my care of the patient were reviewed by me and considered in my medical decision making (see chart for details).  45 year old male presenting for stye of the right lower eyelid.  Vitals normal and stable.  No fever.  No significant swelling of face.  Treating him at this time with erythromycin ointment.  Also advised warm compresses.  Thoroughly reviewed return and ED precautions with patient.  Final Clinical Impressions(s) / UC Diagnoses   Final diagnoses:  Hordeolum externum of right lower eyelid     Discharge Instructions      -You have a stye.  Use warm compresses several times throughout the day. I have sent an antibiotic ointment to the  pharmacy.- -This should get better in the next few days. -If for some reason you have a fever, significantly increased swelling or redness of your face or significantly increased pain you should go to the emergency department.     ED Prescriptions     Medication Sig Dispense Auth. Provider   erythromycin ophthalmic  ointment Place a 1/2 inch ribbon of ointment into the lower eyelid q6h 3.5 g Shirlee Latch, PA-C      PDMP not reviewed this encounter.   Shirlee Latch, PA-C 04/03/21 1946

## 2021-04-03 NOTE — ED Triage Notes (Signed)
Pt presents today with c/o of pain/redness/ swelling to right lower lid x 2 days. Denies injury or change in vision.

## 2021-04-03 NOTE — Discharge Instructions (Addendum)
-  You have a stye.  Use warm compresses several times throughout the day. I have sent an antibiotic ointment to the pharmacy.- -This should get better in the next few days. -If for some reason you have a fever, significantly increased swelling or redness of your face or significantly increased pain you should go to the emergency department.

## 2022-03-01 DIAGNOSIS — N529 Male erectile dysfunction, unspecified: Secondary | ICD-10-CM | POA: Insufficient documentation

## 2022-05-15 ENCOUNTER — Encounter: Payer: Self-pay | Admitting: Internal Medicine

## 2022-05-15 DIAGNOSIS — G4719 Other hypersomnia: Secondary | ICD-10-CM

## 2022-05-22 NOTE — Procedures (Signed)
[facility]  Polysomnogram Report Part I                                                                 Phone: [phone] Fax: [phone]  Patient Name: John Terrell, John Terrell Acquisition Number: [id number]  Date of Birth: 12-29-1975 Acquisition Date: 05/15/2022  Referring Physician: Dorrene German. Coward NP     History: The patient is a 46 year old male who was referred for evaluation of possible sleep apnea with snoring and sleepiness. Medical History: Excessive daytime sleepiness, obesity, RLS, hypothyroidism, high cholesterol   Medications: cholecalciferol, cyanocobalamin, levothyroxine, multivitamin, prednisolone, simvastatin  Procedure: This routine overnight polysomnogram was performed on the [facility] 5 using the standard diagnostic protocol. This included 6 channels of EEG, 2 channels of EOG, chin EMG, bilateral anterior tibialis EMG, nasal/oral thermistor, PTAF (nasal pressure transducer), chest and abdominal wall movements, EKG, and pulse oximetry.  Description: The total recording time was 432.5 minutes. The total sleep time was 400.0 minutes. There were a total of 17.5 minutes of wakefulness after sleep onset for a goodsleep efficiency of 92.5%. The latency to sleep onset was within normal limits at 15.0 minutes. The R sleep onset latency was within normal limits at 94.0 minutes. Sleep parameters, as a percentage of the total sleep time, demonstrated 3.6% of sleep was in N1 sleep, 81.4% N2, 7.1% N3 and 7.9% R sleep. There were a total of 38 arousals for an arousal index of 5.7 arousals per hour of sleep that was normal.  Respiratory monitoring demonstrated occasional mild degree of snoring. Only 12 minutes of non-supine sleep were observed. There were 88 apneas and hypopneas for an Apnea Hypopnea Index of 13.2 apneas and hypopneas per hour of sleep. The REM related apnea hypopnea index was 55.2/hr of REM sleep compared to a NREM AHI of 9.6/hr. The Respiratory Disturbance  Index, which includes 1 respiratory effort related arousals (RERAs), was 13.4 respiratory events per hour of sleep.  The average duration of the respiratory events was 17.0 seconds with a maximum duration of 30.0 seconds. The respiratory events were associated with peripheral oxygen desaturations on the average to 86%. The lowest oxygen desaturation associated with a respiratory event was 77%. Additionally, the baseline oxygen saturation during wakefulness was 91%, during NREM sleep averaged 90%, and during REM sleep averaged  89%. The total duration of oxygen < 90% was 194.5 minutes and <80% was 0.1 minutes.  Cardiac monitoring- did not demonstrate transient cardiac decelerations associated with the apneas. There were no significant cardiac rhythm irregularities.   Periodic limb movement monitoring- did not demonstrate periodic limb movements.    Impression: This routine overnight polysomnogram demonstrated significant obstructive sleep apnea with an overall Apnea Hypopnea Index of 13.2 apneas and hypopneas per hour of sleep, which increased to 55.2 in REM sleep. The lowest desaturation was to 77%.  As REM sleep was significantly reduced, the findings likely underestimate the severity of the sleep apnea. Most sleep was in the supine position.  There were reduced percentages of REM and slow wave sleep. These findings would appear to be due to the obstructive sleep apnea.  Recommendations:    A CPAP titration would be recommended due to the severity of the sleep apnea. Some supine sleep should be  ensured to optimize the titration. Additionally, would recommend weight loss in a patient with a BMI of 49.4.     Yevonne Pax, MD, [facility] Diplomate ABMS-Pulmonary, Critical Care and Sleep Medicine  Electronically reviewed and digitally signed   [facility] Polysomnogram Report Part II  Phone: [phone] Fax: [phone]  Patient last name [place] Neck Size 18.5   in.  Acquisition [id number]  Patient first name John Terrell. Weight 395.0 lbs. Started 05/15/2022 at 9:57:30 PM  Birth date Apr 27, 1976 Height 75.0 in. Stopped 05/16/2022 at 5:22:36 AM  Age 28 BMI 49.4 lb/in2 Duration 432.5  Study Type Adult      John Terrell - RPSGT, Reita Chard Reviewed by: Valentino Hue. Henke, PhD, ABSM, FAASM Sleep Data: Lights Out: 10:07:30 PM Sleep Onset: 10:22:30 PM  Lights On: 5:20:00 AM Sleep Efficiency: 92.5 %  Total Recording Time: 432.5 min Sleep Latency (from Lights Off) 15.0 min  Total Sleep Time (TST): 400.0 min R Latency (from Sleep Onset): 94.0 min  Sleep Period Time: 417.0 min Total number of awakenings: 13  Wake during sleep: 17.0 min Wake After Sleep Onset (WASO): 17.5 min   Sleep Data:         Arousal Summary: Stage  Latency from lights out (min) Latency from sleep onset (min) Duration (min) % Total Sleep Time  Normal values  N 1 15.0 0.0 14.5 3.6 (5%)  N 2 17.0 2.0 325.5 81.4 (50%)  N 3 29.5 14.5 28.5 7.1 (20%)  R 109.0 94.0 31.5 7.9 (25%)    Number Index  Spontaneous 36 5.4  Apneas & Hypopneas 2 0.3  RERAs 1 0.1       (Apneas & Hypopneas & RERAs)  (3) (0.5)  Limb Movement 0 0.0  Snore 0 0.0  TOTAL 39 5.8     Respiratory Data:  CA OA MA Apnea Hypopnea* A+ H RERA Total  Number 0 5 0 5 83 88 1 89  Mean Dur (sec) 0.0 10.3 0.0 10.3 17.4 17.0 21.0 17.0  Max Dur (sec) 0.0 11.5 0.0 11.5 30.0 30.0 21.0 30.0  Total Dur (min) 0.0 0.9 0.0 0.9 24.0 24.9 0.3 25.2  % of TST 0.0 0.2 0.0 0.2 6.0 6.2 0.1 6.3  Index (#/h TST) 0.0 0.8 0.0 0.8 12.4 13.2 0.1 13.4  *Hypopneas scored based on 4% or greater desaturation.  Sleep Stage:        REM NREM TST  AHI 55.2 9.6 13.2  RDI 55.2 9.8 13.4           Body Position Data:  Sleep (min) TST (%) REM (min) NREM (min) CA (#) OA (#) MA (#) HYP (#) AHI (#/h) RERA (#) RDI (#/h) Desat (#)  Supine 388.0 97.00 31.5 356.5 0 5 0 83 13.6 1 13.8 286  Non-Supine 12.00 3.00 0.00 12.00 0.00 0.00 0.00 0.00 0.00 0 0.00  2.00  Right: 12.0 3.00 0.0 12.0 0 0 0 0 0.0 0 0.00 2     Snoring: Total number of snoring episodes  0  Total time with snoring    min (   % of sleep)   Oximetry Distribution:             WK REM NREM TOTAL  Average (%)   91 89 90 90  < 90% 10.7 15.9 167.9 194.5  < 80% 0.0 0.1 0.0 0.1  < 70% 0.0 0.0 0.0 0.0  # of Desaturations* 9 53 226 288  Desat Index (#/hour) 16.7 101.0 36.8  43.2  Desat Max (%) 7 15 11 15   Desat Max Dur (sec) 101.0 45.0 96.0 101.0  Approx Min O2 during sleep 77  Approx min O2 during a respiratory event 77  Was Oxygen added (Y/N) and final rate No:   0 LPM  *Desaturations based on 3% or greater drop from baseline.   Cheyne Stokes Breathing: None Present   Heart Rate Summary:  Average Heart Rate During Sleep 69.7 bpm      Highest Heart Rate During Sleep (95th %) 77.0 bpm      Highest Heart Rate During Sleep 122 bpm      Highest Heart Rate During Recording (TIB) 128 bpm       Heart Rate Observations: Event Type # Events   Bradycardia 0 Lowest HR Scored: N/A  Sinus Tachycardia During Sleep 0 Highest HR Scored: N/A  Narrow Complex Tachycardia 0 Highest HR Scored: N/A  Wide Complex Tachycardia 0 Highest HR Scored: N/A  Asystole 0 Longest Pause: N/A  Atrial Fibrillation 0 Duration Longest Event: N/A  Other Arrythmias  No Type:    Periodic Limb Movement Data: (Primary legs unless otherwise noted) Total # Limb Movement 0 Limb Movement Index 0.0  Total # PLMS    PLMS Index     Total # PLMS Arousals    PLMS Arousal Index     Percentage Sleep Time with PLMS   min (   % sleep)  Mean Duration limb movements (secs)

## 2022-06-16 ENCOUNTER — Encounter: Payer: Self-pay | Admitting: Internal Medicine

## 2022-06-16 DIAGNOSIS — G4733 Obstructive sleep apnea (adult) (pediatric): Secondary | ICD-10-CM

## 2022-06-22 NOTE — Procedures (Signed)
[facility]  Polysomnogram Report Part I  Phone: [phone] Fax: [phone]  Patient Name: John Terrell, John Terrell Acquisition Number: [id number]  Date of Birth: 05/07/1976 Acquisition Date: 06/16/2022  Referring Physician: Dorrene German. Coward NP     History: The patient is a 46 year old male with obstructive sleep apnea for CPAP titration. Medical History: obesity, RLS, hypothyroidism, high cholesterol.  Medications: cholecalciferol, cynaocobalamin, levothyroxine, multi-vitamin, prednisone, simvastatin  Procedure: This routine overnight polysomnogram was performed on the [facility] 5 using the standard CPAP protocol. This included 6 channels of EEG, 2 channels of EOG, chin EMG, bilateral anterior tibialis EMG, nasal/oral thermistor, PTAF (nasal pressure transducer), chest and abdominal wall movements, EKG, and pulse oximetry.  Description: The total recording time was 405.0 minutes. The total sleep time was 371.5 minutes. There were a total of 27.0 minutes of wakefulness after sleep onset for a slightly reducedsleep efficiency of 91.7%. The latency to sleep onset was shortat 6.5 minutes. The R sleep onset latency was within normal limits at 63.5 minutes. Sleep parameters, as a percentage of the total sleep time, demonstrated 4.4% of sleep was in N1 sleep, 81.7% N2, 3.1% N3 and 10.8% R sleep. There were a total of 17 arousals for an arousal index of 2.7 arousals per hour of sleep that was normal.  Overall, there were a total of 56 respiratory events for a respiratory disturbance index, which includes apneas, hypopneas and RERAs (increased respiratory effort) of 9.0 respiratory events per hour of sleep during the pressure titration. CPAP was initiated at 5 cm H2O at lights out, 10:36 p.m. It was titrated in 1-2 cm increments, largely for REM-related events to the final pressure of 15 cm H2O. The apnea was controlled at this pressure although post-arousal central apneas and some  obstructive events during elevated mask leak were observed. The apnea was controlled in non-REM at 5 cm H2O.  Additionally, the baseline oxygen saturation during wakefulness was 95%, during NREM sleep averaged 95%, and during REM sleep averaged 94%. The total duration of oxygen < 90% was 2.5 minutes.  Cardiac monitoring- There were no significant cardiac rhythm irregularities.   Periodic limb movement monitoring- did not demonstrate periodic limb movements.   Impression: This patient's obstructive sleep apnea demonstrated significant improvement with the utilization of nasal CPAP at 15 cm H2O. Lower pressures were effective in non-REM sleep.     Recommendations: Would recommend utilization of auto-adjusting  CPAP at 5-15 cm H2O.      An AirFit N20 mask, size Medium, was used. Chin strap used during study- Yes. Humidifier used during study- Yes.     Yevonne Pax, MD, [facility] Diplomate ABMS-Pulmonary, Critical Care and Sleep Medicine  Electronically reviewed and digitally signed  [facility] CPAP/BIPAP Polysomnogram Report Part II Phone: [phone] Fax: [phone]  Patient last name Grudzien Neck Size 18.5 in. Acquisition [phone]  Patient first name John Terrell. Weight 395.0 lbs. Started 06/16/2022 at 10:30:24 PM  Birth date March 30, 1976 Height 75.0 in. Stopped 06/17/2022 at 5:25:06 AM  Age 84      Type Adult BMI 49.4 lb/in2 Duration 405.0  Otho Perl - RPSGT, Reita Chard  Reviewed by: Valentino Hue. Henke, PhD, ABSM, FAASM Sleep Data: Lights Out: 10:36:24 PM Sleep Onset: 10:42:54 PM  Lights On: 5:21:24 AM Sleep Efficiency: 91.7 %  Total Recording Time: 405.0 min Sleep Latency (from Lights Off) 6.5 min  Total Sleep Time (TST): 371.5 min R Latency (from Sleep Onset): 63.5 min  Sleep Period Time: 398.0 min Total number of awakenings: 11  Wake during sleep: 26.5 min Wake After Sleep Onset (WASO): 27.0 min   Sleep Data:         Arousal Summary: Stage  Latency from lights  out (min) Latency from sleep onset (min) Duration (min) % Total Sleep Time  Normal values  N 1 6.5 0.0 16.5 4.4 (5%)  N 2 7.0 0.5 303.5 81.7 (50%)  N 3 23.0 16.5 11.5 3.1 (20%)  R 70.0 63.5 40.0 10.8 (25%)    Number Index  Spontaneous 16 2.6  Apneas & Hypopneas 1 0.2  RERAs 0 0.0       (Apneas & Hypopneas & RERAs)  (1) (0.2)  Limb Movement 0 0.0  Snore 0 0.0  TOTAL 17 2.7     Respiratory Data:  CA OA MA Apnea Hypopnea* A+ H RERA Total  Number 9 15 0 24 32 56 0 56  Mean Dur (sec) 12.6 13.0 0.0 12.9 16.3 14.8 0.0 14.8  Max Dur (sec) 16.5 19.5 0.0 19.5 23.0 23.0 0.0 23.0  Total Dur (min) 1.9 3.3 0.0 5.1 8.7 13.8 0.0 13.8  % of TST 0.5 0.9 0.0 1.4 2.3 3.7 0.0 3.7  Index (#/h TST) 1.5 2.4 0.0 3.9 5.2 9.0 0.0 9.0  *Hypopneas scored based on 4% or greater desaturation.  Sleep Stage:         REM NREM TST  AHI 19.5 7.8 9.0  RDI 19.5 7.8 9.0    Sleep (min) TST (%) REM (min) NREM (min) CA (#) OA (#) MA (#) HYP (#) AHI (#/h) RERA (#) RDI (#/h) Desat (#)  Supine 361.5 97.31 40.0 321.5 9 15  0 32 9.3 0 9.3 66  Non-Supine 10.00 2.69 0.00 10.00 0.00 0.00 0.00 0.00 0.00 0 0.00 1.00  Right: 10.0 2.69 0.0 10.0 0 0 0 0 0.0 0 0.00 1     Snoring: Total number of snoring episodes  0  Total time with snoring    min (   % of sleep)   Oximetry Distribution:             WK REM NREM TOTAL  Average (%)   95 94 95 95  < 90% 0.0 2.2 0.3 2.5  < 80% 0.0 0.0 0.0 0.0  < 70% 0.0 0.0 0.0 0.0  # of Desaturations* 5 20 43 68  Desat Index (#/hour) 9.0 30.0 7.8 11.0  Desat Max (%) 4 10 10 10   Desat Max Dur (sec) 33.5 62.0 70.0 70.0  Approx Min O2 during sleep 85  Approx min O2 during a respiratory event 85  Was Oxygen added (Y/N) and final rate No:   0 LPM  *Desaturations based on 3% or greater drop from baseline.   Cheyne Stokes Breathing: None Present    Heart Rate Summary:  Average Heart Rate During Sleep 70.9 bpm      Highest Heart Rate During Sleep (95th %) 81.0 bpm       Highest Heart Rate During Sleep 180 bpm (artifact)  Highest Heart Rate During Recording (TIB) 210 bpm (artifact)   Heart Rate Observations: Event Type # Events   Bradycardia 0 Lowest HR Scored: N/A  Sinus Tachycardia During Sleep 0 Highest HR Scored: N/A  Narrow Complex Tachycardia 0 Highest HR Scored: N/A  Wide Complex Tachycardia 0 Highest HR Scored: N/A  Asystole 0 Longest Pause: N/A  Atrial Fibrillation 0 Duration Longest Event: N/A  Other Arrythmias  No Type:   Periodic  Limb Movement Data: (Primary legs unless otherwise noted) Total # Limb Movement 0 Limb Movement Index 0.0  Total # PLMS    PLMS Index     Total # PLMS Arousals    PLMS Arousal Index     Percentage Sleep Time with PLMS   min (   % sleep)  Mean Duration limb movements (secs)       IPAP Level (cmH2O) EPAP Level (cmH2O) Total Duration (min) Sleep Duration (min) Sleep (%) REM (%) CA  #) OA # MA # HYP #) AHI (#/hr) RERAs # RERAs (#/hr) RDI (#/hr)  5 5 64.0 64.0 100.0 0.8 0 2 0 1 2.8 0 0.0 2.8  7 7  12.7 12.7 100.0 100.0 0 1 0 3 18.9 0 0.0 18.9  8 8  115.4 110.4 95.7 1.6 0 0 0 3 1.6 0 0.0 1.6  9 9  3.0 3.0 100.0 100.0 0 0 0 2 40.0 0 0.0 40.0  10 10 3.7 3.7 100.0 100.0 0 0 0 0 0.0 0 0.0 0.0  11 11 81.7 78.7 96.3 1.5 1 1  0 4 4.6 0 0.0 4.6  12 12  14.5 14.5 100.0 16.6 0 1 0 2 12.4 0 0.0 12.4  13 13  44.6 31.1 69.7 10.8 5 4  0 8 32.8 0 0.0 32.8  14 14  13.8 13.8 100.0 19.6 0 0 0 2 8.7 0 0.0 8.7  15 15  15.6 15.6 100.0 32.1 2 1  0 4 26.9 0 0.0 26.9  16 16  24.9 19.9 79.9 0.0 1 4 0 2 21.1 0 0.0 21.1

## 2022-11-05 ENCOUNTER — Ambulatory Visit (INDEPENDENT_AMBULATORY_CARE_PROVIDER_SITE_OTHER): Payer: 59 | Admitting: Internal Medicine

## 2022-11-05 VITALS — BP 156/88 | HR 79 | Resp 12 | Ht 75.0 in | Wt 396.0 lb

## 2022-11-05 DIAGNOSIS — G4733 Obstructive sleep apnea (adult) (pediatric): Secondary | ICD-10-CM | POA: Diagnosis not present

## 2022-11-05 DIAGNOSIS — Z7189 Other specified counseling: Secondary | ICD-10-CM | POA: Diagnosis not present

## 2022-11-05 NOTE — Progress Notes (Unsigned)
[facility] [address] [place], [place] [address]  Pulmonary Sleep Medicine   Office Visit Note  Patient Name: John Terrell DOB: Mar 03, 1976 MRN [id number]    Chief Complaint: Obstructive Sleep Apnea visit  Brief History:  Aldin is seen today for an initial sleep consultation after set up  on APAP 5-15cmh20.  The patient has a 6 month history of sleep apnea. Patient is using PAP nightly.  The patient feels rested after sleeping with PAP.  The patient reports benefit from PAP use. Reported sleepiness is  much improved and the Epworth Sleepiness Score is 2 out of 24. The patient does not take naps. The patient complains of the following: no major issues noted.  The compliance download shows 97% compliance with an average use time of 8 hours. The AHI is 1.0  The patient does complain of limb movements disrupting sleep.  ROS  General: (-) fever, (-) chills, (-) night sweat Nose and Sinuses: (-) nasal stuffiness or itchiness, (-) postnasal drip, (-) nosebleeds, (-) sinus trouble. Mouth and Throat: (-) sore throat, (-) hoarseness. Neck: (-) swollen glands, (-) enlarged thyroid, (-) neck pain. Respiratory: - cough, - shortness of breath, - wheezing. Neurologic: - numbness, - tingling. Psychiatric: - anxiety, - depression   Current Medication: Outpatient Encounter Medications as of 11/05/2022  Medication Sig Note   tadalafil (CIALIS) 5 MG tablet Take by mouth.    erythromycin ophthalmic ointment Place a 1/2 inch ribbon of ointment into the lower eyelid q6h    levothyroxine (SYNTHROID) 125 MCG tablet TAKE (1) TABLET BY MOUTH ONCE DAILY.    metFORMIN (GLUCOPHAGE-XR) 500 MG 24 hr tablet SMARTSIG:1 Tablet(s) By Mouth Every Evening    Multiple Vitamin (MULTIVITAMIN WITH MINERALS) TABS tablet Take 1 tablet by mouth daily. 09/11/2019: Fish oil Vitamin c Vitamin b12 magnesium   simvastatin (ZOCOR) 40 MG tablet TAKE (1) TABLET BY MOUTH AT BEDTIME.    No facility-administered  encounter medications on file as of 11/05/2022.    Surgical History: Past Surgical History:  Procedure Laterality Date   COLONOSCOPY N/A 07/12/2017   Procedure: COLONOSCOPY;  Surgeon: Malissa Hippo, MD;  [facility];  Service: Endoscopy;  Laterality: N/A;  1:25   ESOPHAGEAL DILATION N/A 03/11/2017   Procedure: ESOPHAGEAL DILATION;  Surgeon: Malissa Hippo, MD;  Location: [facility];  Service: Endoscopy;  Laterality: N/A;   ESOPHAGOGASTRODUODENOSCOPY N/A 03/11/2017   Procedure: ESOPHAGOGASTRODUODENOSCOPY (EGD);  Surgeon: Malissa Hippo, MD;  Location: [facility];  Service: Endoscopy;  Laterality: N/A;   HERNIA REPAIR  2008 - 2009    Medical History: Past Medical History:  Diagnosis Date   Anemia    GERD (gastroesophageal reflux disease)    Hyperlipidemia    Hyperlipidemia     Family History: Non contributory to the present illness  Social History: Social History   Socioeconomic History   Marital status: Married    Spouse name: Not on file   Number of children: Not on file   Years of education: Not on file   Highest education level: Not on file  Occupational History   Not on file  Tobacco Use   Smoking status: Never   Smokeless tobacco: Never  Vaping Use   Vaping Use: Never used  Substance and Sexual Activity   Alcohol use: Yes    Alcohol/week: 0.0 standard drinks of alcohol   Drug use: No   Sexual activity: Not on file  Other Topics Concern   Not on file  Social History Narrative   Not on file   Social Determinants of Health   Financial Resource Strain: Not on file  Food Insecurity: Not on file  Transportation Needs: Not on file  Physical Activity: Not on file  Stress: Not on file  Social Connections: Not on file  Intimate Partner Violence: Not on file    Vital Signs: Blood pressure (!) 156/88, pulse 79, resp. rate 12, height 6\' 3"  (1.905 m), weight (!) 396 lb (179.6 kg), SpO2 96 %. Body mass index is 49.5 kg/m.     Examination: General Appearance: The patient is well-developed, well-nourished, and in no distress. Neck Circumference: 46cm Skin: Gross inspection of skin unremarkable. Head: normocephalic, no gross deformities. Eyes: no gross deformities noted. ENT: ears appear grossly normal Neurologic: Alert and oriented. No involuntary movements.  STOP BANG RISK ASSESSMENT S (snore) Have you been told that you snore?     No   T (tired) Are you often tired, fatigued, or sleepy during the day?   NO  O (obstruction) Do you stop breathing, choke, or gasp during sleep? NO   P (pressure) Do you have or are you being treated for high blood pressure? NO   B (BMI) Is your body index greater than 35 kg/m? YES   A (age) Are you 45 years old or older? NO   N (neck) Do you have a neck circumference greater than 16 inches?   YES   G (gender) Are you a male? YES   TOTAL STOP/BANG "YES" ANSWERS        A STOP-Bang score of 2 or less is considered low risk, and a score of 5 or more is high risk for having either moderate or severe OSA. For people who score 3 or 4, doctors may need to perform further assessment to determine how likely they are to have OSA.         EPWORTH SLEEPINESS SCALE:  Scale:  (0)= no chance of dozing; (1)= slight chance of dozing; (2)= moderate chance of dozing; (3)= high chance of dozing  Chance  Situtation    Sitting and reading: 0    Watching TV: 1    Sitting Inactive in public: 0    As a passenger in car: 1      Lying down to rest: 0    Sitting and talking: 0    Sitting quielty after lunch: 0    In a car, stopped in traffic: 0   TOTAL SCORE:   2 out of 24    SLEEP STUDIES:  PSG (05/15/22) AHI 13, REM AHI 55, min SPO2 77%   CPAP COMPLIANCE DATA:  Date Range: 07/24/22-10/31/2022  Average Daily Use: 8 hours  Median Use: 8 hrs 10 min  Compliance for > 4 Hours: 97% days  AHI: 1.0 respiratory events per hour  Days Used: 98/100  Mask Leak:  28.2  95th Percentile Pressure: 8.4         LABS: No results found for this or any previous visit (from the past 2160 hour(s)).  Radiology: No results found.  No results found.  No results found.    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 OSA (obstructive sleep apnea) The patient does tolerate PAP and reports  benefit from PAP use. The patient was reminded how to clean equipment and advised to replace supplies routinely. The patient was also counselled on weight loss. The compliance is excellent. The AHI is 1.0.   OSA on cpap- controlled. Continue with excellent compliance with pap. CPAP continues to be medically necessary to treat this patient's OSA. F/u 33m   2. CPAP use counseling CPAP Counseling: had a lengthy discussion with the patient regarding the importance of PAP therapy in management of the sleep apnea. Patient appears to understand the risk factor reduction and also understands the risks associated with untreated sleep apnea. Patient will try to make a good faith effort to remain compliant with therapy. Also instructed the patient on proper cleaning of the device including the water must be changed daily if possible and use of distilled water is preferred. Patient understands that the machine should be regularly cleaned with appropriate recommended cleaning solutions that do not damage the PAP machine for example given white vinegar and water rinses. Other methods  such as ozone treatment may not be as good as these simple methods to achieve cleaning.   3. Morbid obesity (HCC) Obesity Counseling: Had a lengthy discussion regarding patients BMI and weight issues. Patient was instructed on portion control as well as increased activity. Also discussed caloric restrictions with trying to maintain intake less than 2000 Kcal. Discussions were made in accordance with the 5As of weight management. Simple actions such as not eating late and if able to, taking a walk is suggested.       General Counseling: I have discussed the findings of the evaluation and examination with Fraser Din.  I have also discussed any further diagnostic evaluation thatmay be needed or ordered today. La verbalizes understanding of the findings of todays visit. We also reviewed his medications today and discussed drug interactions and side effects including but not limited excessive drowsiness and altered mental states. We also discussed that there is always a risk not just to him but also people around him. he has been encouraged to call the office with any questions or concerns that should arise related to todays visit.  No orders of the defined types were placed in this encounter.       I have personally obtained a history, examined the patient, evaluated laboratory and imaging results, formulated the assessment and plan and placed orders. This patient was seen today by Emmaline Kluver, PA-C in collaboration with Dr. Freda Munro.   Yevonne Pax, MD [facility] Diplomate ABMS Pulmonary Critical Care Medicine and Sleep Medicine

## 2023-05-10 NOTE — Progress Notes (Unsigned)
[facility] [address] [place], [place] [address]  Pulmonary Sleep Medicine   Office Visit Note  Patient Name: John Terrell DOB: [birth year] MRN [id number]    Chief Complaint: Obstructive Sleep Apnea visit  Brief History:  Richardson is seen today for a follow up visit for APAP@ 5-15 cmH2O. The patient has a 11 month history of sleep apnea. Patient is using PAP nightly.  The patient feels rested after sleeping with PAP.  The patient reports benefiting from PAP use. Reported sleepiness is  improved and the Epworth Sleepiness Score is 1 out of 24. The patient does not take naps. The patient complains of the following: sometimes has a mark on face from the mask, it goes away pretty quickly however.  The compliance download shows 99% compliance with an average use time of 8 hours 12 minutes. The AHI is 0.7.  The patient does not complain  of limb movements disrupting sleep. The patient continues to require PAP therapy in order to eliminate sleep apnea. Patient reports he is much less sleepy during the day now that he is on CPAP.   ROS  General: (-) fever, (-) chills, (-) night sweat Nose and Sinuses: (-) nasal stuffiness or itchiness, (-) postnasal drip, (-) nosebleeds, (-) sinus trouble. Mouth and Throat: (-) sore throat, (-) hoarseness. Neck: (-) swollen glands, (-) enlarged thyroid, (-) neck pain. Respiratory: - cough, - shortness of breath, - wheezing. Neurologic: - numbness, - tingling. Psychiatric: - anxiety, - depression   Current Medication: Outpatient Encounter Medications as of 05/13/2023  Medication Sig Note   metFORMIN (GLUCOPHAGE-XR) 750 MG 24 hr tablet Take by mouth.    erythromycin ophthalmic ointment Place a 1/2 inch ribbon of ointment into the lower eyelid q6h    levothyroxine (SYNTHROID) 125 MCG tablet TAKE (1) TABLET BY MOUTH ONCE DAILY.    Multiple Vitamin (MULTIVITAMIN WITH MINERALS) TABS tablet Take 1 tablet by mouth daily. 09/11/2019: Fish oil  Vitamin c Vitamin b12 magnesium   simvastatin (ZOCOR) 40 MG tablet TAKE (1) TABLET BY MOUTH AT BEDTIME.    tadalafil (CIALIS) 5 MG tablet Take by mouth.    [DISCONTINUED] metFORMIN (GLUCOPHAGE-XR) 500 MG 24 hr tablet SMARTSIG:1 Tablet(s) By Mouth Every Evening    No facility-administered encounter medications on file as of 05/13/2023.    Surgical History: Past Surgical History:  Procedure Laterality Date   COLONOSCOPY N/A 07/12/2017   Procedure: COLONOSCOPY;  Surgeon: Malissa Hippo, MD;  [facility];  Service: Endoscopy;  Laterality: N/A;  1:25   ESOPHAGEAL DILATION N/A 03/11/2017   Procedure: ESOPHAGEAL DILATION;  Surgeon: Malissa Hippo, MD;  [facility];  Service: Endoscopy;  Laterality: N/A;   ESOPHAGOGASTRODUODENOSCOPY N/A 03/11/2017   Procedure: ESOPHAGOGASTRODUODENOSCOPY (EGD);  Surgeon: Malissa Hippo, MD;  [facility];  Service: Endoscopy;  Laterality: N/A;   HERNIA REPAIR  2008 - 2009    Medical History: Past Medical History:  Diagnosis Date   Anemia    GERD (gastroesophageal reflux disease)    Hyperlipidemia    Hyperlipidemia     Family History: Non contributory to the present illness  Social History: Social History   Socioeconomic History   Marital status: Married    Spouse name: Not on file   Number of children: Not on file   Years of education: Not on file   Highest education level: Not on file  Occupational History   Not on file  Tobacco Use   Smoking status: Never  Smokeless tobacco: Never  Vaping Use   Vaping status: Never Used  Substance and Sexual Activity   Alcohol use: Yes    Alcohol/week: 0.0 standard drinks of alcohol   Drug use: No   Sexual activity: Not on file  Other Topics Concern   Not on file  Social History Narrative   Not on file   Social Determinants of Health   Financial Resource Strain: Low Risk  (03/06/2023)   Received from [facility]   Overall Financial Resource  Strain (CARDIA)    Difficulty of Paying Living Expenses: Not hard at all  Food Insecurity: No Food Insecurity (03/06/2023)   Received from [facility]   Hunger Vital Sign    Worried About Running Out of Food in the Last Year: Never true    Ran Out of Food in the Last Year: Never true  Transportation Needs: No Transportation Needs (03/06/2023)   Received from [facility] - Transportation    In the past 12 months, has lack of transportation kept you from medical appointments or from getting medications?: No    Lack of Transportation (Non-Medical): No  Physical Activity: Not on file  Stress: Not on file  Social Connections: Not on file  Intimate Partner Violence: Not on file    Vital Signs: Blood pressure (!) 141/90, pulse 72, resp. rate 20, height 6\' 3"  (1.905 m), weight (!) 390 lb 8 oz (177.1 kg), SpO2 96%. Body mass index is 48.81 kg/m.    Examination: General Appearance: The patient is well-developed, well-nourished, and in no distress. Neck Circumference: 46.5 cm Skin: Gross inspection of skin unremarkable. Head: normocephalic, no gross deformities. Eyes: no gross deformities noted. ENT: ears appear grossly normal Neurologic: Alert and oriented. No involuntary movements.  STOP BANG RISK ASSESSMENT S (snore) Have you been told that you snore?     NO   T (tired) Are you often tired, fatigued, or sleepy during the day?   NO  O (obstruction) Do you stop breathing, choke, or gasp during sleep? NO   P (pressure) Do you have or are you being treated for high blood pressure? NO   B (BMI) Is your body index greater than 35 kg/m? YES   A (age) Are you 45 years old or older? NO   N (neck) Do you have a neck circumference greater than 16 inches?   YES   G (gender) Are you a male? YES   TOTAL STOP/BANG "YES" ANSWERS 3       A STOP-Bang score of 2 or less is considered low risk, and a score of 5 or more is high risk for having  either moderate or severe OSA. For people who score 3 or 4, doctors may need to perform further assessment to determine how likely they are to have OSA.         EPWORTH SLEEPINESS SCALE:  Scale:  (0)= no chance of dozing; (1)= slight chance of dozing; (2)= moderate chance of dozing; (3)= high chance of dozing  Chance  Situtation    Sitting and reading: 0    Watching TV: 0    Sitting Inactive in public: 0    As a passenger in car: 1      Lying down to rest: 0    Sitting and talking: 0    Sitting quielty after lunch: 0    In a car, stopped in traffic: 0   TOTAL SCORE:   1  out of 24    SLEEP STUDIES:  PSG (04/2022) AHI 13/hr, REM AHI 55/hr, min SpO2 77%   CPAP COMPLIANCE DATA:  Date Range: 11/09/2022-05/07/2023  Average Daily Use: 8 hours 12 minutes  Median Use: 8 hours 9 minutes  Compliance for > 4 Hours: 99%  AHI: 0.7 respiratory events per hour  Days Used: 180/180 days  Mask Leak: 29.1  95th Percentile Pressure: 8.1         LABS: No results found for this or any previous visit (from the past 2160 hour(s)).  Radiology: No results found.  No results found.  No results found.    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 OSA (obstructive sleep apnea) The patient does tolerate PAP and reports  benefit from PAP use. The patient was reminded how to clean equipment and advised to replace supplies routinely. The patient was also counselled  on weight loss. The compliance is excellent. The AHI is 0.7.   OSA on cpap- controlled. Continue with excellent compliance with pap. CPAP continues to be medically necessary to treat this patient's OSA. F/u one year.    2. CPAP use counseling CPAP Counseling: had a lengthy discussion with the patient regarding the importance of PAP therapy in management of the sleep apnea. Patient appears to understand the risk factor reduction and also understands the risks associated with untreated sleep apnea. Patient will try to make a good faith effort to remain compliant with therapy. Also instructed the patient on proper cleaning of the device including the water must be changed daily if possible and use of distilled water is preferred. Patient understands that the machine should be regularly cleaned with appropriate recommended cleaning solutions that do not damage the PAP machine for example given white vinegar and water rinses. Other methods such as ozone treatment may not be as good as these simple methods to achieve cleaning.   3. Morbid obesity (HCC) Obesity Counseling: Had a lengthy discussion regarding patients BMI and weight issues. Patient was instructed on portion control as well as increased activity. Also discussed caloric restrictions with trying to maintain intake less than 2000 Kcal. Discussions were made in accordance with the 5As of weight management. Simple actions such as not eating late and if able to, taking a walk is suggested.      General Counseling: I have discussed the findings of the evaluation and examination with Fraser Din.  I have also discussed any further diagnostic evaluation thatmay be needed or ordered today. Govani verbalizes understanding of the findings of todays visit. We also reviewed his medications today and discussed drug interactions and side effects including but not limited excessive drowsiness and altered mental states. We also discussed that there is always a risk  not just to him but also people around him. he has been encouraged to call the office with any questions or concerns that should arise related to todays visit.  No orders of the defined types were placed in this encounter.  I have personally obtained a history, examined the patient, evaluated laboratory and imaging results, formulated the assessment and plan and placed orders. This patient was seen today by Emmaline Kluver, PA-C in collaboration with Dr. Freda Munro.   Yevonne Pax, MD [facility] Diplomate ABMS Pulmonary Critical Care Medicine and Sleep Medicine

## 2023-05-13 ENCOUNTER — Ambulatory Visit (INDEPENDENT_AMBULATORY_CARE_PROVIDER_SITE_OTHER): Payer: 59 | Admitting: Internal Medicine

## 2023-05-13 VITALS — BP 141/90 | HR 72 | Resp 20 | Ht 75.0 in | Wt 390.5 lb

## 2023-05-13 DIAGNOSIS — G4733 Obstructive sleep apnea (adult) (pediatric): Secondary | ICD-10-CM | POA: Diagnosis not present

## 2023-05-13 DIAGNOSIS — Z7189 Other specified counseling: Secondary | ICD-10-CM | POA: Diagnosis not present

## 2023-05-13 NOTE — Patient Instructions (Signed)

## 2023-08-16 ENCOUNTER — Encounter: Payer: Self-pay | Admitting: Gastroenterology

## 2023-08-30 ENCOUNTER — Encounter: Payer: Self-pay | Admitting: Gastroenterology

## 2023-09-02 ENCOUNTER — Ambulatory Visit: Admitting: Anesthesiology

## 2023-09-02 ENCOUNTER — Ambulatory Visit
Admission: RE | Admit: 2023-09-02 | Discharge: 2023-09-02 | Disposition: A | Discharge status: Home / Self Care | Payer: 59 | Attending: Gastroenterology | Admitting: Gastroenterology

## 2023-09-02 ENCOUNTER — Encounter: Payer: Self-pay | Admitting: Gastroenterology

## 2023-09-02 ENCOUNTER — Encounter
Admission: RE | Disposition: A | Discharge status: Home / Self Care | Payer: Self-pay | Source: Home / Self Care | Attending: Gastroenterology

## 2023-09-02 DIAGNOSIS — K640 First degree hemorrhoids: Secondary | ICD-10-CM | POA: Diagnosis not present

## 2023-09-02 DIAGNOSIS — Z79899 Other long term (current) drug therapy: Secondary | ICD-10-CM | POA: Diagnosis not present

## 2023-09-02 DIAGNOSIS — G4730 Sleep apnea, unspecified: Secondary | ICD-10-CM | POA: Diagnosis not present

## 2023-09-02 DIAGNOSIS — K219 Gastro-esophageal reflux disease without esophagitis: Secondary | ICD-10-CM | POA: Insufficient documentation

## 2023-09-02 DIAGNOSIS — D122 Benign neoplasm of ascending colon: Secondary | ICD-10-CM | POA: Diagnosis not present

## 2023-09-02 DIAGNOSIS — E039 Hypothyroidism, unspecified: Secondary | ICD-10-CM | POA: Diagnosis not present

## 2023-09-02 DIAGNOSIS — K6389 Other specified diseases of intestine: Secondary | ICD-10-CM | POA: Insufficient documentation

## 2023-09-02 DIAGNOSIS — Z1211 Encounter for screening for malignant neoplasm of colon: Secondary | ICD-10-CM | POA: Diagnosis present

## 2023-09-02 DIAGNOSIS — Z83719 Family history of colon polyps, unspecified: Secondary | ICD-10-CM | POA: Diagnosis not present

## 2023-09-02 DIAGNOSIS — Z6841 Body Mass Index (BMI) 40.0 and over, adult: Secondary | ICD-10-CM | POA: Insufficient documentation

## 2023-09-02 DIAGNOSIS — D124 Benign neoplasm of descending colon: Secondary | ICD-10-CM | POA: Diagnosis not present

## 2023-09-02 DIAGNOSIS — E6689 Other obesity not elsewhere classified: Secondary | ICD-10-CM | POA: Diagnosis not present

## 2023-09-02 DIAGNOSIS — Z7984 Long term (current) use of oral hypoglycemic drugs: Secondary | ICD-10-CM | POA: Insufficient documentation

## 2023-09-02 DIAGNOSIS — Z79890 Hormone replacement therapy (postmenopausal): Secondary | ICD-10-CM | POA: Insufficient documentation

## 2023-09-02 DIAGNOSIS — D123 Benign neoplasm of transverse colon: Secondary | ICD-10-CM | POA: Diagnosis not present

## 2023-09-02 HISTORY — PX: COLONOSCOPY WITH PROPOFOL: SHX5780

## 2023-09-02 HISTORY — PX: POLYPECTOMY: SHX149

## 2023-09-02 SURGERY — COLONOSCOPY WITH PROPOFOL
Anesthesia: General

## 2023-09-02 MED ORDER — LIDOCAINE HCL (CARDIAC) PF 100 MG/5ML IV SOSY
PREFILLED_SYRINGE | INTRAVENOUS | Status: DC | PRN
  Administered 2023-09-02: 100 mg via INTRAVENOUS

## 2023-09-02 MED ORDER — PROPOFOL 10 MG/ML IV BOLUS
INTRAVENOUS | Status: DC | PRN
  Administered 2023-09-02: 30 mg via INTRAVENOUS
  Administered 2023-09-02: 20 mg via INTRAVENOUS
  Administered 2023-09-02: 50 mg via INTRAVENOUS
  Administered 2023-09-02: 20 mg via INTRAVENOUS

## 2023-09-02 MED ORDER — DEXMEDETOMIDINE HCL IN NACL 80 MCG/20ML IV SOLN
INTRAVENOUS | Status: DC | PRN
  Administered 2023-09-02: 20 ug via INTRAVENOUS

## 2023-09-02 MED ORDER — PROPOFOL 500 MG/50ML IV EMUL
INTRAVENOUS | Status: DC | PRN
  Administered 2023-09-02: 50 ug/kg/min via INTRAVENOUS

## 2023-09-02 MED ORDER — SODIUM CHLORIDE 0.9 % IV SOLN
INTRAVENOUS | Status: DC
  Administered 2023-09-02: 20 mL/h via INTRAVENOUS

## 2023-09-02 MED ORDER — LIDOCAINE HCL (PF) 2 % IJ SOLN
INTRAMUSCULAR | Status: AC
  Filled 2023-09-02: qty 5

## 2023-09-02 MED ORDER — PROPOFOL 1000 MG/100ML IV EMUL
INTRAVENOUS | Status: AC
  Filled 2023-09-02: qty 100

## 2023-09-02 NOTE — Op Note (Signed)
 [facility] Gastroenterology Patient Name: John Terrell Procedure Date: 09/02/2023 9:01 AM MRN: [id number] Account #: [id number] Date of Birth: 06-Apr-1976 Admit Type: Outpatient Age: 48 Room: [facility] ENDO ROOM 2 Gender: Male Note Status: Finalized Instrument Name: Prentice Docker [id number] Procedure:             Colonoscopy Indications:           Colon cancer screening in patient at increased risk:                         Family history of 1st-degree relative with colon polyps Providers:             Jaynie Collins DO, DO Medicines:             Monitored Anesthesia Care Complications:         No immediate complications. Estimated blood loss:                         Minimal. Procedure:             Pre-Anesthesia Assessment:                        - Prior to the procedure, a History and Physical was                         performed, and patient medications and allergies were                         reviewed. The patient is competent. The risks and                         benefits of the procedure and the sedation options and                         risks were discussed with the patient. All questions                         were answered and informed consent was obtained.                         Patient identification and proposed procedure were                         verified by the physician, the nurse, the anesthetist                         and the technician in the endoscopy suite. Mental                         Status Examination: alert and oriented. Airway                         Examination: normal oropharyngeal airway and neck                         mobility. Respiratory Examination: clear to                         auscultation. CV Examination: RRR, no  murmurs, no S3                         or S4. Prophylactic Antibiotics: The patient does not                         require prophylactic antibiotics. Prior                         Anticoagulants: The  patient has taken no anticoagulant                         or antiplatelet agents. ASA Grade Assessment: III - A                         patient with severe systemic disease. After reviewing                         the risks and benefits, the patient was deemed in                         satisfactory condition to undergo the procedure. The                         anesthesia plan was to use monitored anesthesia care                         (MAC). Immediately prior to administration of                         medications, the patient was re-assessed for adequacy                         to receive sedatives. The heart rate, respiratory                         rate, oxygen saturations, blood pressure, adequacy of                         pulmonary ventilation, and response to care were                         monitored throughout the procedure. The physical                         status of the patient was re-assessed after the                         procedure.                        After obtaining informed consent, the colonoscope was                         passed under direct vision. Throughout the procedure,                         the patient's blood pressure, pulse, and oxygen  saturations were monitored continuously. The                         Colonoscope was introduced through the anus and                         advanced to the the terminal ileum, with                         identification of the appendiceal orifice and IC                         valve. The colonoscopy was performed without                         difficulty. The patient tolerated the procedure well.                         The quality of the bowel preparation was evaluated                         using the BBPS [facility] Bowel Preparation Scale) with                         scores of: Right Colon = 3, Transverse Colon = 3 and                         Left Colon = 3 (entire mucosa seen well with  no                         residual staining, small fragments of stool or opaque                         liquid). The total BBPS score equals 9. The terminal                         ileum, ileocecal valve, appendiceal orifice, and                         rectum were photographed. Findings:      The perianal and digital rectal examinations were normal. Pertinent       negatives include normal sphincter tone.      The terminal ileum appeared normal. Estimated blood loss: none.      Retroflexion in the right colon was performed.      A localized area of granular mucosa was found in the cecum. Biopsies       were taken with a cold forceps for histology. Estimated blood loss was       minimal.      Three sessile polyps were found in the descending colon, transverse       colon and ascending colon. The polyps were 3 to 7 mm in size. These       polyps were removed with a cold snare. Resection and retrieval were       complete. Estimated blood loss was minimal.      Non-bleeding internal hemorrhoids were found during retroflexion. The       hemorrhoids were Grade I (internal hemorrhoids that do  not prolapse).       Estimated blood loss: none.      The exam was otherwise without abnormality on direct and retroflexion       views. Impression:            - The examined portion of the ileum was normal.                        - Granularity in the cecum. Biopsied.                        - Three 3 to 7 mm polyps in the descending colon, in                         the transverse colon and in the ascending colon,                         removed with a cold snare. Resected and retrieved.                        - Non-bleeding internal hemorrhoids.                        - The examination was otherwise normal on direct and                         retroflexion views. Recommendation:        - Patient has a contact number available for                         emergencies. The signs and symptoms of potential                          delayed complications were discussed with the patient.                         Return to normal activities tomorrow. Written                         discharge instructions were provided to the patient.                        - Discharge patient to home.                        - Resume previous diet.                        - Continue present medications.                        - No ibuprofen, naproxen, or other non-steroidal                         anti-inflammatory drugs for 5 days after polyp removal.                        - OK to resume glp1 agonist                        -  Await pathology results.                        - Repeat colonoscopy for surveillance based on                         pathology results.                        - Return to referring physician as previously                         scheduled.                        - The findings and recommendations were discussed with                         the patient. Procedure Code(s):     --- Professional ---                        [phone], Colonoscopy, flexible; with removal of                         tumor(s), polyp(s), or other lesion(s) by snare                         technique                        [id number], 59, Colonoscopy, flexible; with biopsy, single                         or multiple Diagnosis Code(s):     --- Professional ---                        Z83.71, Family history of colonic polyps                        K64.0, First degree hemorrhoids                        K63.89, Other specified diseases of intestine                        D12.4, Benign neoplasm of descending colon                        D12.3, Benign neoplasm of transverse colon (hepatic                         flexure or splenic flexure)                        D12.2, Benign neoplasm of ascending colon CPT copyright 2022 American Medical Association. All rights reserved. The codes documented in this report are preliminary and upon  coder review may  be revised to meet current compliance requirements. Attending Participation:      I personally performed the entire procedure. Elfredia Nevins, DO Jaynie Collins DO, DO 09/02/2023 9:47:00 AM This report has been signed electronically. Number of Addenda: 0 Note Initiated On: 09/02/2023  9:01 AM Scope Withdrawal Time: 0 hours 13 minutes 26 seconds  Total Procedure Duration: 0 hours 18 minutes 55 seconds  Estimated Blood Loss:  Estimated blood loss was minimal.      [facility]

## 2023-09-02 NOTE — Anesthesia Preprocedure Evaluation (Signed)
 Anesthesia Evaluation  Patient identified by MRN, date of birth, ID band Patient awake    Reviewed: Allergy & Precautions, NPO status , Patient's Chart, lab work & pertinent test results  Airway Mallampati: III  TM Distance: >3 FB Neck ROM: full    Dental  (+) Chipped, Dental Advidsory Given   Pulmonary sleep apnea and Continuous Positive Airway Pressure Ventilation    Pulmonary exam normal        Cardiovascular negative cardio ROS Normal cardiovascular exam     Neuro/Psych negative neurological ROS  negative psych ROS   GI/Hepatic Neg liver ROS,GERD  Medicated and Controlled,,  Endo/Other  Hypothyroidism  Class 3 obesity  Renal/GU negative Renal ROS  negative genitourinary   Musculoskeletal   Abdominal   Peds  Hematology negative hematology ROS (+)   Anesthesia Other Findings Past Medical History: No date: Anemia No date: GERD (gastroesophageal reflux disease) No date: Hyperlipidemia  Past Surgical History: 07/12/2017: COLONOSCOPY; N/A     Comment:  Procedure: COLONOSCOPY;  Surgeon: Malissa Hippo, MD;               [facility];  Service: Endoscopy;                Laterality: N/A;  1:25 03/11/2017: ESOPHAGEAL DILATION; N/A     Comment:  Procedure: ESOPHAGEAL DILATION;  Surgeon: Malissa Hippo, MD;  [facility];  Service: Endoscopy;                Laterality: N/A; 03/11/2017: ESOPHAGOGASTRODUODENOSCOPY; N/A     Comment:  Procedure: ESOPHAGOGASTRODUODENOSCOPY (EGD);  Surgeon:               Malissa Hippo, MD;  [facility];  Service:              Endoscopy;  Laterality: N/A; 2008 - 2009: HERNIA REPAIR  BMI    Body Mass Index: 46.13 kg/m      Reproductive/Obstetrics negative OB ROS                             Anesthesia Physical Anesthesia Plan  ASA: 3  Anesthesia Plan: General   Post-op Pain Management: Minimal or no  pain anticipated   Induction: Intravenous  PONV Risk Score and Plan: 3 and Propofol infusion, TIVA and Ondansetron  Airway Management Planned: Nasal CPAP  Additional Equipment: None  Intra-op Plan:   Post-operative Plan:   Informed Consent: I have reviewed the patients History and Physical, chart, labs and discussed the procedure including the risks, benefits and alternatives for the proposed anesthesia with the patient or authorized representative who has indicated his/her understanding and acceptance.     Dental advisory given  Plan Discussed with: CRNA and Surgeon  Anesthesia Plan Comments: (Discussed risks of anesthesia with patient, including possibility of difficulty with spontaneous ventilation under anesthesia necessitating airway intervention, PONV, and rare risks such as cardiac or respiratory or neurological events, and allergic reactions. Discussed the role of CRNA in patient's perioperative care. Patient understands.)       Anesthesia Quick Evaluation

## 2023-09-02 NOTE — Interval H&P Note (Signed)
 History and Physical Interval Note: Preprocedure H&P from 09/02/23  was reviewed and there was no interval change after seeing and examining the patient.  Written consent was obtained from the patient after discussion of risks, benefits, and alternatives. Patient has consented to proceed with Colonoscopy with possible intervention   09/02/2023 9:11 AM  John Terrell  has presented today for surgery, with the diagnosis of Z12.11 (ICD-10-CM) - Colon cancer screening Z83.719 (ICD-10-CM) - Family history of colonic polyps.  The various methods of treatment have been discussed with the patient and family. After consideration of risks, benefits and other options for treatment, the patient has consented to  Procedure(s): COLONOSCOPY WITH PROPOFOL (N/A) as a surgical intervention.  The patient's history has been reviewed, patient examined, no change in status, stable for surgery.  I have reviewed the patient's chart and labs.  Questions were answered to the patient's satisfaction.     John Terrell

## 2023-09-02 NOTE — Transfer of Care (Signed)
 Immediate Anesthesia Transfer of Care Note  Patient: John Terrell  Procedure(s) Performed: COLONOSCOPY WITH PROPOFOL POLYPECTOMY, INTESTINE  Patient Location: PACU  Anesthesia Type:General  Level of Consciousness: sedated  Airway & Oxygen Therapy: Patient Spontanous Breathing  Post-op Assessment: Report given to RN and Post -op Vital signs reviewed and stable  Post vital signs: Reviewed and stable  Last Vitals:  Vitals Value Taken Time  BP    Temp    Pulse 69 09/02/23 0944  Resp 13 09/02/23 0944  SpO2 97 % 09/02/23 0944  Vitals shown include unfiled device data.  Last Pain:  Vitals:   09/02/23 0835  TempSrc: Temporal  PainSc: 0-No pain         Complications: No notable events documented.

## 2023-09-02 NOTE — H&P (Signed)
 Pre-Procedure H&P   Patient ID: John Terrell is a 48 y.o. male.  Gastroenterology Provider: Jaynie Collins, DO  Referring Provider: Laren Everts, NP PCP: Luciana Axe, NP  Date: 09/02/2023  HPI Mr. John Terrell is a 48 y.o. male who presents today for Colonoscopy for Colorectal cancer screening, family history of colon polyps .  Patient reports bowel movements moving regularly without melena or hematochezia. He does note some irritation with Bms at times which he attributes to his hemorrhoids. Sx improve with preparation H use.  Last underwent colonoscopy January 2019 with internal hemorrhoids and normal TI.  Otherwise unremarkable  Father with history of colon polyps  Hemoglobin 12.6 MCV 82 platelets 235,000 creatinine 0.7  Patient is on Zepbound which has been held for the procedure (last dose >1 week ago)   Past Medical History:  Diagnosis Date   Anemia    GERD (gastroesophageal reflux disease)    Hyperlipidemia     Past Surgical History:  Procedure Laterality Date   COLONOSCOPY N/A 07/12/2017   Procedure: COLONOSCOPY;  Surgeon: Malissa Hippo, MD;  [facility];  Service: Endoscopy;  Laterality: N/A;  1:25   ESOPHAGEAL DILATION N/A 03/11/2017   Procedure: ESOPHAGEAL DILATION;  Surgeon: Malissa Hippo, MD;  [facility];  Service: Endoscopy;  Laterality: N/A;   ESOPHAGOGASTRODUODENOSCOPY N/A 03/11/2017   Procedure: ESOPHAGOGASTRODUODENOSCOPY (EGD);  Surgeon: Malissa Hippo, MD;  [facility];  Service: Endoscopy;  Laterality: N/A;   HERNIA REPAIR  2008 - 2009    Family History Father- colon polyps No h/o GI disease or malignancy  Review of Systems  Constitutional:  Negative for activity change, appetite change, chills, diaphoresis, fatigue, fever and unexpected weight change.  HENT:  Negative for trouble swallowing and voice change.   Respiratory:  Negative for shortness of breath and wheezing.    Cardiovascular:  Negative for chest pain, palpitations and leg swelling.  Gastrointestinal:  Negative for abdominal distention, abdominal pain, anal bleeding, blood in stool, constipation, diarrhea, nausea and vomiting.  Musculoskeletal:  Negative for arthralgias and myalgias.  Skin:  Negative for color change and pallor.  Neurological:  Negative for dizziness, syncope and weakness.  Psychiatric/Behavioral:  Negative for confusion. The patient is not nervous/anxious.   All other systems reviewed and are negative.    Medications No current facility-administered medications on file prior to encounter.   Current Outpatient Medications on File Prior to Encounter  Medication Sig Dispense Refill   erythromycin ophthalmic ointment Place a 1/2 inch ribbon of ointment into the lower eyelid q6h 3.5 g 0   levothyroxine (SYNTHROID) 125 MCG tablet TAKE (1) TABLET BY MOUTH ONCE DAILY. 30 tablet 11   metFORMIN (GLUCOPHAGE-XR) 750 MG 24 hr tablet Take by mouth.     simvastatin (ZOCOR) 40 MG tablet TAKE (1) TABLET BY MOUTH AT BEDTIME. 30 tablet 5   tadalafil (CIALIS) 5 MG tablet Take by mouth.     tirzepatide (MOUNJARO) 2.5 MG/0.5ML Pen Inject 2.5 mg into the skin once a week.     Multiple Vitamin (MULTIVITAMIN WITH MINERALS) TABS tablet Take 1 tablet by mouth daily.      Pertinent medications related to GI and procedure were reviewed by me with the patient prior to the procedure   Current Facility-Administered Medications:    0.9 %  sodium chloride infusion, , Intravenous, Continuous, Jaynie Collins, DO, Last Rate: 20 mL/hr at 09/02/23 0850, 20 mL/hr at 09/02/23 0850  sodium chloride 20 mL/hr (  09/02/23 0850)       Allergies  Allergen Reactions   Amoxil [Amoxicillin]     Rash, can take cephalosporin   Allergies were reviewed by me prior to the procedure  Objective   Body mass index is 46.13 kg/m. Vitals:   09/02/23 0835  BP: (!) 150/97  Pulse: 63  Resp: 20  Temp: (!) 96.4 F  (35.8 C)  TempSrc: Temporal  SpO2: 97%  Weight: (!) 171.9 kg  Height: 6\' 4"  (1.93 m)     Physical Exam Vitals and nursing note reviewed.  Constitutional:      General: He is not in acute distress.    Appearance: Normal appearance. He is obese. He is not ill-appearing, toxic-appearing or diaphoretic.  HENT:     Head: Normocephalic and atraumatic.     Nose: Nose normal.     Mouth/Throat:     Mouth: Mucous membranes are moist.     Pharynx: Oropharynx is clear.  Eyes:     General: No scleral icterus.    Extraocular Movements: Extraocular movements intact.  Cardiovascular:     Rate and Rhythm: Normal rate and regular rhythm.     Heart sounds: Normal heart sounds. No murmur heard.    No friction rub. No gallop.  Pulmonary:     Effort: Pulmonary effort is normal. No respiratory distress.     Breath sounds: Normal breath sounds. No wheezing, rhonchi or rales.  Abdominal:     General: Bowel sounds are normal. There is no distension.     Palpations: Abdomen is soft.     Tenderness: There is no abdominal tenderness. There is no guarding or rebound.  Musculoskeletal:     Cervical back: Neck supple.     Right lower leg: No edema.     Left lower leg: No edema.  Skin:    General: Skin is warm and dry.     Coloration: Skin is not jaundiced or pale.  Neurological:     General: No focal deficit present.     Mental Status: He is alert and oriented to person, place, and time. Mental status is at baseline.  Psychiatric:        Mood and Affect: Mood normal.        Behavior: Behavior normal.        Thought Content: Thought content normal.        Judgment: Judgment normal.      Assessment:  Mr. John Terrell is a 48 y.o. male  who presents today for Colonoscopy for Colorectal cancer screening, family history of colon polyps .  Plan:  Colonoscopy with possible intervention today  Colonoscopy with possible biopsy, control of bleeding, polypectomy, and interventions as necessary  has been discussed with the patient/patient representative. Informed consent was obtained from the patient/patient representative after explaining the indication, nature, and risks of the procedure including but not limited to death, bleeding, perforation, missed neoplasm/lesions, cardiorespiratory compromise, and reaction to medications. Opportunity for questions was given and appropriate answers were provided. Patient/patient representative has verbalized understanding is amenable to undergoing the procedure.   Jaynie Collins, DO  [facility]  Portions of the record may have been created with voice recognition software. Occasional wrong-word or 'sound-a-like' substitutions may have occurred due to the inherent limitations of voice recognition software.  Read the chart carefully and recognize, using context, where substitutions may have occurred.

## 2023-09-02 NOTE — Anesthesia Postprocedure Evaluation (Signed)
 Anesthesia Post Note  Patient: John Terrell  Procedure(s) Performed: COLONOSCOPY WITH PROPOFOL POLYPECTOMY, INTESTINE  Patient location during evaluation: PACU Anesthesia Type: General Level of consciousness: awake and alert Pain management: pain level controlled Vital Signs Assessment: post-procedure vital signs reviewed and stable Respiratory status: spontaneous breathing, nonlabored ventilation, respiratory function stable and patient connected to nasal cannula oxygen Cardiovascular status: blood pressure returned to baseline and stable Postop Assessment: no apparent nausea or vomiting Anesthetic complications: no  No notable events documented.   Last Vitals:  Vitals:   09/02/23 0954 09/02/23 1004  BP: 127/83 110/65  Pulse:    Resp: (!) 21   Temp:    SpO2:      Last Pain:  Vitals:   09/02/23 1004  TempSrc:   PainSc: 0-No pain                 Stephanie Coup

## 2023-09-03 LAB — SURGICAL PATHOLOGY

## 2024-05-15 NOTE — Progress Notes (Unsigned)
 [facility] [address] [place], [place] [address]  Pulmonary Sleep Medicine   Office Visit Note  Patient Name: John Terrell DOB: [birth year] MRN [id number]    Chief Complaint: Obstructive Sleep Apnea visit  Brief History:  Johnthomas is seen today for an annual follow up visit for APAP@ 5-15 cmH2O. The patient has a 2 year history of sleep apnea. Patient is using PAP nightly.  The patient feels rested after sleeping with PAP.  The patient reports benefiting from PAP use. Reported sleepiness is  improved and the Epworth Sleepiness Score is 1 out of 24. The patient does not take naps. The patient complains of the following: pt is in need of a full face mask as he is mouth venting during this season. Patient reports history of allergies.  The compliance download shows 99% compliance with an average use time of 8 hours 12 minutes. The AHI is 0.5. The patient does not complain of limb movements disrupting sleep. The patient continues to require PAP therapy in order to eliminate sleep apnea.   ROS  General: (-) fever, (-) chills, (-) night sweat Nose and Sinuses: (-) nasal stuffiness or itchiness, (-) postnasal drip, (-) nosebleeds, (-) sinus trouble. Mouth and Throat: (-) sore throat, (-) hoarseness. Neck: (-) swollen glands, (-) enlarged thyroid, (-) neck pain. Respiratory: - cough, - shortness of breath, - wheezing. Neurologic: - numbness, - tingling. Psychiatric: - anxiety, - depression   Current Medication: Outpatient Encounter Medications as of 05/18/2024  Medication Sig Note   erythromycin  ophthalmic ointment Place a 1/2 inch ribbon of ointment into the lower eyelid q6h    levothyroxine  (SYNTHROID ) 125 MCG tablet TAKE (1) TABLET BY MOUTH ONCE DAILY.    metFORMIN (GLUCOPHAGE-XR) 750 MG 24 hr tablet Take by mouth.    Multiple Vitamin (MULTIVITAMIN WITH MINERALS) TABS tablet Take 1 tablet by mouth daily. 09/11/2019: Fish oil Vitamin c Vitamin b12 magnesium    simvastatin  (ZOCOR ) 40 MG tablet TAKE (1) TABLET BY MOUTH AT BEDTIME.    tadalafil (CIALIS) 5 MG tablet Take by mouth.    [DISCONTINUED] tirzepatide (MOUNJARO) 2.5 MG/0.5ML Pen Inject 2.5 mg into the skin once a week.    No facility-administered encounter medications on file as of 05/18/2024.    Surgical History: Past Surgical History:  Procedure Laterality Date   COLONOSCOPY N/A 07/12/2017   Procedure: COLONOSCOPY;  Surgeon: Golda Claudis PENNER, MD;  [facility];  Service: Endoscopy;  Laterality: N/A;  1:25   COLONOSCOPY WITH PROPOFOL  N/A 09/02/2023   Procedure: COLONOSCOPY WITH PROPOFOL ;  Surgeon: Onita Elspeth Sharper, DO;  Location: [facility];  Service: Gastroenterology;  Laterality: N/A;   ESOPHAGEAL DILATION N/A 03/11/2017   Procedure: ESOPHAGEAL DILATION;  Surgeon: Golda Claudis PENNER, MD;  [facility];  Service: Endoscopy;  Laterality: N/A;   ESOPHAGOGASTRODUODENOSCOPY N/A 03/11/2017   Procedure: ESOPHAGOGASTRODUODENOSCOPY (EGD);  Surgeon: Golda Claudis PENNER, MD;  [facility];  Service: Endoscopy;  Laterality: N/A;   HERNIA REPAIR  2008 - 2009   POLYPECTOMY  09/02/2023   Procedure: POLYPECTOMY, INTESTINE;  Surgeon: Onita Elspeth Sharper, DO;  Location: [facility] END[facility];  Service: Gastroenterology;;    Medical History: Past Medical History:  Diagnosis Date   Anemia    GERD (gastroesophageal reflux disease)    Hyperlipidemia     Family History: Non contributory to the present illness  Social History: Social History   Socioeconomic History   Marital status: Married    Spouse name: Not on file  Number of children: Not on file   Years of education: Not on file   Highest education level: Not on file  Occupational History   Not on file  Tobacco Use   Smoking status: Never   Smokeless tobacco: Never  Vaping Use   Vaping status: Never Used  Substance and Sexual Activity   Alcohol use: Yes    Alcohol/week: 0.0 standard drinks of alcohol    Drug use: No   Sexual activity: Not on file  Other Topics Concern   Not on file  Social History Narrative   Not on file   Social Drivers of Health   Financial Resource Strain: Low Risk  (03/06/2024)   Received from [facility]   Overall Financial Resource Strain (CARDIA)    Difficulty of Paying Living Expenses: Not hard at all  Food Insecurity: No Food Insecurity (03/06/2024)   Received from [facility]   Hunger Vital Sign    Within the past 12 months, you worried that your food would run out before you got the money to buy more.: Never true    Within the past 12 months, the food you bought just didn't last and you didn't have money to get more.: Never true  Transportation Needs: No Transportation Needs (03/06/2024)   Received from [facility] - Transportation    In the past 12 months, has lack of transportation kept you from medical appointments or from getting medications?: No    Lack of Transportation (Non-Medical): No  Physical Activity: Sufficiently Active (03/06/2024)   Received from [facility]   Exercise Vital Sign    On average, how many days per week do you engage in moderate to strenuous exercise (like a brisk walk)?: 3 days    On average, how many minutes do you engage in exercise at this level?: 60 min  Stress: No Stress Concern Present (03/06/2024)   Received from [facility] - Occupational Stress Questionnaire    Feeling of Stress : Only a little  Social Connections: Socially Integrated (03/06/2024)   Received from [facility]   Social Connection and Isolation Panel    In a typical week, how many times do you talk on the phone with family, friends, or neighbors?: More than three times a week    How often do you get together with friends or relatives?: Once a week    How often do you attend church or religious  services?: More than 4 times per year    Do you belong to any clubs or organizations such as church groups, unions, fraternal or athletic groups, or school groups?: Yes    How often do you attend meetings of the clubs or organizations you belong to?: More than 4 times per year    Are you married, widowed, divorced, separated, never married, or living with a partner?: Married  Intimate Partner Violence: Not on file    Vital Signs: Blood pressure (!) 144/89, pulse 76, resp. rate 16, height 6' 4 (1.93 m), weight (!) 354 lb (160.6 kg), SpO2 98%. Body mass index is 43.09 kg/m.    Examination: General Appearance: The patient is well-developed, well-nourished, and in no distress. Neck Circumference: 46.5 cm Skin: Gross inspection of skin unremarkable. Head: normocephalic, no gross deformities. Eyes: no gross deformities noted. ENT: ears appear grossly normal Neurologic: Alert and oriented. No involuntary movements.  STOP BANG RISK  ASSESSMENT S (snore) Have you been told that you snore?     NO   T (tired) Are you often tired, fatigued, or sleepy during the day?   NO  O (obstruction) Do you stop breathing, choke, or gasp during sleep? NO   P (pressure) Do you have or are you being treated for high blood pressure? NO   B (BMI) Is your body index greater than 35 kg/m? YES   A (age) Are you 81 years old or older? NO   N (neck) Do you have a neck circumference greater than 16 inches?   YES   G (gender) Are you a male? YES   TOTAL STOP/BANG "YES" ANSWERS 3       A STOP-Bang score of 2 or less is considered low risk, and a score of 5 or more is high risk for having either moderate or severe OSA. For people who score 3 or 4, doctors may need to perform further assessment to determine how likely they are to have OSA.         EPWORTH SLEEPINESS SCALE:  Scale:  (0)= no chance of dozing; (1)= slight chance of dozing; (2)= moderate chance of dozing; (3)= high chance of dozing  Chance   Situtation    Sitting and reading: 0    Watching TV: 1    Sitting Inactive in public: 0    As a passenger in car: 0      Lying down to rest: 0    Sitting and talking: 0    Sitting quielty after lunch: 0    In a car, stopped in traffic: 0   TOTAL SCORE:   1 out of 24    SLEEP STUDIES:  PSG (04/2022) AHI 13/hr, REM AHI 55/hr, min SpO2 77%   CPAP COMPLIANCE DATA:  Date Range: 05/14/2023-05/12/2024  Average Daily Use: 8 hours 12 minutes  Median Use: 8 hours 18 minutes  Compliance for > 4 Hours: 99%  AHI: 0.5 respiratory events per hour  Days Used: 364/365 days  Mask Leak: 29.9  95th Percentile Pressure: 8.1         LABS: No results found for this or any previous visit (from the past 2160 hours).  Radiology: No results found.  No results found.  No results found.    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 OSA (obstructive sleep apnea) (Primary)  The patient does tolerate PAP and reports  benefit from PAP use. The patient was reminded how to clean equipment and advised to replace supplies routinely. The patient was also counselled on weight loss. The compliance is excellent. The AHI is 0.5.   OSA on cpap- controlled. Continue with excellent compliance with pap. CPAP continues to be medically necessary to treat this patient's OSA. F/u one year.   2. CPAP use counseling CPAP Counseling: had a lengthy discussion with the patient regarding  the importance of PAP therapy in management of the sleep apnea. Patient appears to understand the risk factor  reduction and also understands the risks associated with untreated sleep apnea. Patient will try to make a good faith effort to remain compliant with therapy. Also instructed the patient on proper cleaning of the device including the water  must be changed daily if possible and use of distilled water  is preferred. Patient understands that the machine should be regularly cleaned with appropriate recommended cleaning solutions that do not damage the PAP machine for example given white vinegar and water  rinses. Other methods such as ozone treatment may not be as good as these simple methods to achieve cleaning.   3. Morbid obesity (HCC) Obesity Counseling: Had a lengthy discussion regarding patients BMI and weight issues. Patient was instructed on portion control as well as increased activity. Also discussed caloric restrictions with trying to maintain intake less than 2000 Kcal. Discussions were made in accordance with the 5As of weight management. Simple actions such as not eating late and if able to, taking a walk is suggested.     General Counseling: I have discussed the findings of the evaluation and examination with Bonnie.  I have also discussed any further diagnostic evaluation thatmay be needed or ordered today. Harnoor verbalizes understanding of the findings of todays visit. We also reviewed his medications today and discussed drug interactions and side effects including but not limited excessive drowsiness and altered mental states. We also discussed that there is always a risk not just to him but also people around him. he has been encouraged to call the office with any questions or concerns that should arise related to todays visit.  No orders of the defined types were placed in this encounter.       I have personally obtained a history, examined the patient, evaluated laboratory and  imaging results, formulated the assessment and plan and placed orders. This patient was seen today by Lauraine Lay, PA-C in collaboration with Dr. Elfreda Bathe.   Elfreda DELENA Bathe, MD [facility] Diplomate ABMS Pulmonary Critical Care Medicine and Sleep Medicine

## 2024-05-18 ENCOUNTER — Ambulatory Visit (INDEPENDENT_AMBULATORY_CARE_PROVIDER_SITE_OTHER): Admitting: Internal Medicine

## 2024-05-18 VITALS — BP 144/89 | HR 76 | Resp 16 | Ht 76.0 in | Wt 354.0 lb

## 2024-05-18 DIAGNOSIS — G4733 Obstructive sleep apnea (adult) (pediatric): Secondary | ICD-10-CM | POA: Diagnosis not present

## 2024-05-18 DIAGNOSIS — Z7189 Other specified counseling: Secondary | ICD-10-CM

## 2024-05-18 NOTE — Patient Instructions (Signed)
